# Patient Record
Sex: Female | Born: 1935 | Race: White | Hispanic: No | Marital: Married | Smoking: Never smoker
Health system: Southern US, Community
[De-identification: ages and names within clinical notes are randomized; demographics above are authoritative.]

## PROBLEM LIST (undated history)

## (undated) DIAGNOSIS — F0390 Unspecified dementia without behavioral disturbance: Secondary | ICD-10-CM

## (undated) DIAGNOSIS — I480 Paroxysmal atrial fibrillation: Secondary | ICD-10-CM

## (undated) DIAGNOSIS — G1221 Amyotrophic lateral sclerosis: Secondary | ICD-10-CM

## (undated) DIAGNOSIS — K449 Diaphragmatic hernia without obstruction or gangrene: Secondary | ICD-10-CM

## (undated) DIAGNOSIS — K219 Gastro-esophageal reflux disease without esophagitis: Secondary | ICD-10-CM

## (undated) DIAGNOSIS — E785 Hyperlipidemia, unspecified: Secondary | ICD-10-CM

## (undated) DIAGNOSIS — F329 Major depressive disorder, single episode, unspecified: Secondary | ICD-10-CM

## (undated) DIAGNOSIS — D1771 Benign lipomatous neoplasm of kidney: Secondary | ICD-10-CM

## (undated) DIAGNOSIS — D369 Benign neoplasm, unspecified site: Secondary | ICD-10-CM

## (undated) DIAGNOSIS — I10 Essential (primary) hypertension: Secondary | ICD-10-CM

## (undated) DIAGNOSIS — I2510 Atherosclerotic heart disease of native coronary artery without angina pectoris: Secondary | ICD-10-CM

## (undated) DIAGNOSIS — R011 Cardiac murmur, unspecified: Secondary | ICD-10-CM

## (undated) DIAGNOSIS — F32A Depression, unspecified: Secondary | ICD-10-CM

## (undated) DIAGNOSIS — R002 Palpitations: Secondary | ICD-10-CM

## (undated) DIAGNOSIS — N281 Cyst of kidney, acquired: Secondary | ICD-10-CM

## (undated) HISTORY — DX: Major depressive disorder, single episode, unspecified: F32.9

## (undated) HISTORY — DX: Cyst of kidney, acquired: N28.1

## (undated) HISTORY — DX: Gastro-esophageal reflux disease without esophagitis: K21.9

## (undated) HISTORY — DX: Hyperlipidemia, unspecified: E78.5

## (undated) HISTORY — DX: Cardiac murmur, unspecified: R01.1

## (undated) HISTORY — DX: Depression, unspecified: F32.A

## (undated) HISTORY — PX: EYE SURGERY: SHX253

## (undated) HISTORY — DX: Essential (primary) hypertension: I10

## (undated) HISTORY — DX: Benign neoplasm, unspecified site: D36.9

## (undated) HISTORY — DX: Palpitations: R00.2

## (undated) HISTORY — DX: Diaphragmatic hernia without obstruction or gangrene: K44.9

## (undated) HISTORY — DX: Benign lipomatous neoplasm of kidney: D17.71

---

## 1998-03-31 ENCOUNTER — Ambulatory Visit (HOSPITAL_COMMUNITY): Admission: RE | Admit: 1998-03-31 | Discharge: 1998-03-31 | Payer: Self-pay | Admitting: Gastroenterology

## 1999-03-17 ENCOUNTER — Encounter: Admission: RE | Admit: 1999-03-17 | Discharge: 1999-03-17 | Payer: Self-pay | Admitting: Obstetrics and Gynecology

## 1999-03-17 ENCOUNTER — Encounter: Payer: Self-pay | Admitting: Obstetrics and Gynecology

## 1999-07-25 ENCOUNTER — Encounter: Admission: RE | Admit: 1999-07-25 | Discharge: 1999-07-25 | Payer: Self-pay | Admitting: Internal Medicine

## 1999-07-25 ENCOUNTER — Encounter: Payer: Self-pay | Admitting: Internal Medicine

## 1999-11-11 ENCOUNTER — Other Ambulatory Visit: Admission: RE | Admit: 1999-11-11 | Discharge: 1999-11-11 | Payer: Self-pay | Admitting: Obstetrics and Gynecology

## 2000-03-20 ENCOUNTER — Encounter: Payer: Self-pay | Admitting: Obstetrics and Gynecology

## 2000-03-20 ENCOUNTER — Encounter: Admission: RE | Admit: 2000-03-20 | Discharge: 2000-03-20 | Payer: Self-pay | Admitting: Obstetrics and Gynecology

## 2000-09-25 ENCOUNTER — Ambulatory Visit (HOSPITAL_COMMUNITY): Admission: RE | Admit: 2000-09-25 | Discharge: 2000-09-25 | Payer: Self-pay | Admitting: Internal Medicine

## 2000-11-27 ENCOUNTER — Encounter (INDEPENDENT_AMBULATORY_CARE_PROVIDER_SITE_OTHER): Payer: Self-pay | Admitting: Specialist

## 2000-11-27 ENCOUNTER — Ambulatory Visit (HOSPITAL_COMMUNITY): Admission: RE | Admit: 2000-11-27 | Discharge: 2000-11-27 | Payer: Self-pay | Admitting: Gastroenterology

## 2001-02-12 ENCOUNTER — Emergency Department (HOSPITAL_COMMUNITY): Admission: EM | Admit: 2001-02-12 | Discharge: 2001-02-12 | Payer: Self-pay | Admitting: *Deleted

## 2001-04-16 ENCOUNTER — Encounter: Payer: Self-pay | Admitting: Obstetrics and Gynecology

## 2001-04-16 ENCOUNTER — Encounter: Admission: RE | Admit: 2001-04-16 | Discharge: 2001-04-16 | Payer: Self-pay | Admitting: Obstetrics and Gynecology

## 2001-05-03 ENCOUNTER — Other Ambulatory Visit: Admission: RE | Admit: 2001-05-03 | Discharge: 2001-05-03 | Payer: Self-pay | Admitting: Obstetrics and Gynecology

## 2001-12-20 ENCOUNTER — Encounter: Admission: RE | Admit: 2001-12-20 | Discharge: 2001-12-20 | Payer: Self-pay | Admitting: Obstetrics and Gynecology

## 2001-12-20 ENCOUNTER — Encounter: Payer: Self-pay | Admitting: Obstetrics and Gynecology

## 2002-08-21 ENCOUNTER — Encounter: Admission: RE | Admit: 2002-08-21 | Discharge: 2002-08-21 | Payer: Self-pay | Admitting: Obstetrics and Gynecology

## 2002-08-21 ENCOUNTER — Encounter: Payer: Self-pay | Admitting: Obstetrics and Gynecology

## 2003-10-28 ENCOUNTER — Encounter: Admission: RE | Admit: 2003-10-28 | Discharge: 2003-10-28 | Payer: Self-pay | Admitting: Obstetrics and Gynecology

## 2004-03-30 ENCOUNTER — Ambulatory Visit (HOSPITAL_COMMUNITY): Admission: RE | Admit: 2004-03-30 | Discharge: 2004-03-30 | Payer: Self-pay | Admitting: Internal Medicine

## 2004-04-07 ENCOUNTER — Encounter: Admission: RE | Admit: 2004-04-07 | Discharge: 2004-04-07 | Payer: Self-pay | Admitting: Internal Medicine

## 2004-05-10 ENCOUNTER — Encounter: Admission: RE | Admit: 2004-05-10 | Discharge: 2004-05-10 | Payer: Self-pay | Admitting: Family Medicine

## 2004-09-26 ENCOUNTER — Encounter: Admission: RE | Admit: 2004-09-26 | Discharge: 2004-09-26 | Payer: Self-pay | Admitting: Gastroenterology

## 2004-10-03 ENCOUNTER — Encounter: Admission: RE | Admit: 2004-10-03 | Discharge: 2004-10-03 | Payer: Self-pay | Admitting: Gastroenterology

## 2005-02-09 ENCOUNTER — Encounter: Admission: RE | Admit: 2005-02-09 | Discharge: 2005-02-09 | Payer: Self-pay | Admitting: Obstetrics and Gynecology

## 2005-06-19 ENCOUNTER — Ambulatory Visit: Payer: Self-pay | Admitting: Internal Medicine

## 2005-08-03 ENCOUNTER — Ambulatory Visit: Payer: Self-pay

## 2005-08-03 ENCOUNTER — Encounter: Payer: Self-pay | Admitting: Cardiology

## 2006-03-06 ENCOUNTER — Encounter: Admission: RE | Admit: 2006-03-06 | Discharge: 2006-03-06 | Payer: Self-pay | Admitting: Obstetrics and Gynecology

## 2006-06-05 ENCOUNTER — Encounter: Admission: RE | Admit: 2006-06-05 | Discharge: 2006-06-05 | Payer: Self-pay | Admitting: Orthopedic Surgery

## 2006-06-06 ENCOUNTER — Ambulatory Visit (HOSPITAL_BASED_OUTPATIENT_CLINIC_OR_DEPARTMENT_OTHER): Admission: RE | Admit: 2006-06-06 | Discharge: 2006-06-06 | Payer: Self-pay | Admitting: Orthopedic Surgery

## 2006-08-14 ENCOUNTER — Ambulatory Visit: Payer: Self-pay | Admitting: Internal Medicine

## 2006-08-14 LAB — CONVERTED CEMR LAB
HDL: 50.8 mg/dL (ref >39.0)
Total Bilirubin: 0.6 mg/dL (ref 0.3–1.2)
Total CHOL/HDL Ratio: 3.9
Triglycerides: 112 mg/dL (ref 0–149)

## 2006-09-07 ENCOUNTER — Ambulatory Visit: Payer: Self-pay | Admitting: Family Medicine

## 2006-09-07 DIAGNOSIS — I10 Essential (primary) hypertension: Secondary | ICD-10-CM | POA: Insufficient documentation

## 2006-09-07 DIAGNOSIS — F3131 Bipolar disorder, current episode depressed, mild: Secondary | ICD-10-CM | POA: Insufficient documentation

## 2006-09-07 DIAGNOSIS — Z981 Arthrodesis status: Secondary | ICD-10-CM

## 2006-09-13 LAB — CONVERTED CEMR LAB
BUN: 10 mg/dL (ref 6–23)
Basophils Relative: 0.6 % (ref 0.0–1.0)
Calcium: 9.6 mg/dL (ref 8.4–10.5)
Chloride: 105 meq/L (ref 96–112)
GFR calc non Af Amer: 75 mL/min
HCT: 43.4 % (ref 36.0–46.0)
Hemoglobin: 14.7 g/dL (ref 12.0–15.0)
Lymphocytes Relative: 25.4 % (ref 12.0–46.0)
MCV: 89 fL (ref 78.0–100.0)
Monocytes Absolute: 0.4 10*3/uL (ref 0.2–0.7)
Monocytes Relative: 5.8 % (ref 3.0–11.0)
Potassium: 4.1 meq/L (ref 3.5–5.1)
RBC: 4.88 M/uL (ref 3.87–5.11)
RDW: 13.6 % (ref 11.5–14.6)
WBC: 7 10*3/uL (ref 4.5–10.5)

## 2006-12-11 ENCOUNTER — Telehealth: Payer: Self-pay | Admitting: Family Medicine

## 2007-03-06 ENCOUNTER — Encounter: Payer: Self-pay | Admitting: Family Medicine

## 2007-03-11 ENCOUNTER — Encounter: Admission: RE | Admit: 2007-03-11 | Discharge: 2007-03-11 | Payer: Self-pay | Admitting: Obstetrics and Gynecology

## 2007-09-26 ENCOUNTER — Ambulatory Visit: Payer: Self-pay | Admitting: Internal Medicine

## 2007-09-26 LAB — CONVERTED CEMR LAB
ALT: 17 units/L (ref 0–35)
GFR calc Af Amer: 91 mL/min
GFR calc non Af Amer: 75 mL/min
Glucose, Bld: 102 mg/dL — ABNORMAL HIGH (ref 70–99)
Potassium: 4.6 meq/L (ref 3.5–5.1)

## 2007-10-30 ENCOUNTER — Telehealth: Payer: Self-pay | Admitting: Family Medicine

## 2007-10-30 ENCOUNTER — Ambulatory Visit: Payer: Self-pay | Admitting: Family Medicine

## 2007-10-30 DIAGNOSIS — R42 Dizziness and giddiness: Secondary | ICD-10-CM | POA: Insufficient documentation

## 2007-11-07 ENCOUNTER — Ambulatory Visit: Payer: Self-pay | Admitting: Internal Medicine

## 2007-11-11 ENCOUNTER — Telehealth: Payer: Self-pay | Admitting: Family Medicine

## 2007-11-12 ENCOUNTER — Encounter (INDEPENDENT_AMBULATORY_CARE_PROVIDER_SITE_OTHER): Payer: Self-pay | Admitting: *Deleted

## 2007-11-22 ENCOUNTER — Ambulatory Visit: Payer: Self-pay | Admitting: Family Medicine

## 2008-02-21 ENCOUNTER — Ambulatory Visit: Payer: Self-pay | Admitting: Family Medicine

## 2008-03-09 ENCOUNTER — Encounter (INDEPENDENT_AMBULATORY_CARE_PROVIDER_SITE_OTHER): Payer: Self-pay | Admitting: *Deleted

## 2008-03-09 LAB — CONVERTED CEMR LAB
BUN: 13 mg/dL (ref 6–23)
CO2: 30 meq/L (ref 19–32)
GFR calc non Af Amer: 87 mL/min
Glucose, Bld: 93 mg/dL (ref 70–99)
Potassium: 4.1 meq/L (ref 3.5–5.1)
VLDL: 28 mg/dL (ref 0–40)

## 2008-03-17 ENCOUNTER — Encounter: Admission: RE | Admit: 2008-03-17 | Discharge: 2008-03-17 | Payer: Self-pay | Admitting: Family Medicine

## 2008-03-20 ENCOUNTER — Encounter (INDEPENDENT_AMBULATORY_CARE_PROVIDER_SITE_OTHER): Payer: Self-pay | Admitting: *Deleted

## 2008-07-02 ENCOUNTER — Ambulatory Visit: Payer: Self-pay | Admitting: Family Medicine

## 2008-07-02 DIAGNOSIS — J301 Allergic rhinitis due to pollen: Secondary | ICD-10-CM

## 2008-07-14 ENCOUNTER — Telehealth (INDEPENDENT_AMBULATORY_CARE_PROVIDER_SITE_OTHER): Payer: Self-pay | Admitting: *Deleted

## 2008-07-14 DIAGNOSIS — H9209 Otalgia, unspecified ear: Secondary | ICD-10-CM | POA: Insufficient documentation

## 2008-07-20 ENCOUNTER — Telehealth: Payer: Self-pay | Admitting: Family Medicine

## 2008-09-14 ENCOUNTER — Telehealth: Payer: Self-pay | Admitting: Family Medicine

## 2009-02-09 ENCOUNTER — Telehealth: Payer: Self-pay | Admitting: Internal Medicine

## 2009-02-18 ENCOUNTER — Ambulatory Visit: Payer: Self-pay | Admitting: Internal Medicine

## 2009-02-18 ENCOUNTER — Telehealth (INDEPENDENT_AMBULATORY_CARE_PROVIDER_SITE_OTHER): Payer: Self-pay | Admitting: *Deleted

## 2009-02-18 ENCOUNTER — Ambulatory Visit: Payer: Self-pay | Admitting: Diagnostic Radiology

## 2009-02-18 ENCOUNTER — Emergency Department (HOSPITAL_BASED_OUTPATIENT_CLINIC_OR_DEPARTMENT_OTHER): Admission: EM | Admit: 2009-02-18 | Discharge: 2009-02-18 | Payer: Self-pay | Admitting: Emergency Medicine

## 2009-02-18 DIAGNOSIS — R109 Unspecified abdominal pain: Secondary | ICD-10-CM

## 2009-02-19 ENCOUNTER — Encounter: Payer: Self-pay | Admitting: Family Medicine

## 2009-02-22 ENCOUNTER — Encounter (INDEPENDENT_AMBULATORY_CARE_PROVIDER_SITE_OTHER): Payer: Self-pay | Admitting: *Deleted

## 2009-02-23 ENCOUNTER — Telehealth: Payer: Self-pay | Admitting: Internal Medicine

## 2009-03-05 ENCOUNTER — Telehealth: Payer: Self-pay | Admitting: Family Medicine

## 2009-03-09 ENCOUNTER — Ambulatory Visit: Payer: Self-pay | Admitting: Internal Medicine

## 2009-03-11 LAB — CONVERTED CEMR LAB
AST: 20 units/L (ref 0–37)
CO2: 29 meq/L (ref 19–32)
Calcium: 9.1 mg/dL (ref 8.4–10.5)
Chloride: 105 meq/L (ref 96–112)
Cholesterol: 162 mg/dL (ref 0–200)
Creatinine, Ser: 0.9 mg/dL (ref 0.4–1.2)
GFR calc non Af Amer: 65.08 mL/min (ref >60)
Sodium: 140 meq/L (ref 135–145)
Total CHOL/HDL Ratio: 4
VLDL: 26.6 mg/dL (ref 0.0–40.0)

## 2009-03-12 ENCOUNTER — Ambulatory Visit: Payer: Self-pay | Admitting: Internal Medicine

## 2009-03-12 DIAGNOSIS — R002 Palpitations: Secondary | ICD-10-CM | POA: Insufficient documentation

## 2009-03-12 DIAGNOSIS — R9431 Abnormal electrocardiogram [ECG] [EKG]: Secondary | ICD-10-CM

## 2009-03-30 ENCOUNTER — Encounter: Payer: Self-pay | Admitting: Internal Medicine

## 2009-03-30 ENCOUNTER — Ambulatory Visit (HOSPITAL_COMMUNITY): Admission: RE | Admit: 2009-03-30 | Discharge: 2009-03-30 | Payer: Self-pay | Admitting: Internal Medicine

## 2009-03-30 ENCOUNTER — Ambulatory Visit: Payer: Self-pay

## 2009-03-30 ENCOUNTER — Ambulatory Visit: Payer: Self-pay | Admitting: Internal Medicine

## 2009-03-30 ENCOUNTER — Ambulatory Visit: Payer: Self-pay | Admitting: Cardiology

## 2009-04-02 ENCOUNTER — Telehealth: Payer: Self-pay | Admitting: Internal Medicine

## 2009-04-05 ENCOUNTER — Encounter: Payer: Self-pay | Admitting: Internal Medicine

## 2009-04-15 ENCOUNTER — Encounter: Payer: Self-pay | Admitting: Family Medicine

## 2009-04-27 ENCOUNTER — Encounter: Payer: Self-pay | Admitting: Family Medicine

## 2009-05-03 ENCOUNTER — Telehealth: Payer: Self-pay | Admitting: Internal Medicine

## 2009-06-02 ENCOUNTER — Encounter: Payer: Self-pay | Admitting: Family Medicine

## 2009-06-15 ENCOUNTER — Encounter: Admission: RE | Admit: 2009-06-15 | Discharge: 2009-06-15 | Payer: Self-pay | Admitting: Obstetrics and Gynecology

## 2010-02-09 ENCOUNTER — Encounter: Payer: Self-pay | Admitting: Family Medicine

## 2010-03-06 LAB — CONVERTED CEMR LAB
Basophils Absolute: 0.2 10*3/uL — ABNORMAL HIGH (ref 0.0–0.1)
Basophils Relative: 2.9 % (ref 0.0–3.0)
Bilirubin Urine: NEGATIVE
CO2: 29 meq/L (ref 19–32)
Calcium: 9.3 mg/dL (ref 8.4–10.5)
Chloride: 108 meq/L (ref 96–112)
Creatinine, Ser: 0.9 mg/dL (ref 0.4–1.2)
Folate: 17.5 ng/mL
GFR calc Af Amer: 79 mL/min
GFR calc non Af Amer: 65 mL/min
Glucose, Urine, Semiquant: NEGATIVE
HCT: 39.1 % (ref 36.0–46.0)
Hemoglobin: 13.5 g/dL (ref 12.0–15.0)
Lymphocytes Relative: 23.8 % (ref 12.0–46.0)
MCHC: 34.5 g/dL (ref 30.0–36.0)
MCV: 89.4 fL (ref 78.0–100.0)
Monocytes Relative: 8.4 % (ref 3.0–12.0)
Neutro Abs: 3.3 10*3/uL (ref 1.4–7.7)
Neutrophils Relative %: 63.2 % (ref 43.0–77.0)
Protein, U semiquant: 30
RBC: 4.37 M/uL (ref 3.87–5.11)
Sodium: 144 meq/L (ref 135–145)
Specific Gravity, Urine: >=1.03
Urobilinogen, UA: 0.2
WBC: 5.4 10*3/uL (ref 4.5–10.5)

## 2010-03-10 NOTE — Letter (Signed)
Summary: Results Follow up Letter  [facility]  [address] [place], [place] [address]   Phone: [phone]  Fax: [phone]    02/22/2009 MRN: [id number]  [name] [address], [place]  [address]  Dear [name],  The following are the results of your recent test(s):  Test         Result    Pap Smear:        Normal _____  Not Normal _____ Comments: ______________________________________________________ Cholesterol: LDL(Bad cholesterol):         Your goal is less than:         HDL (Good cholesterol):       Your goal is more than: Comments:  ______________________________________________________ Mammogram:        Normal _____  Not Normal _____ Comments:  ___________________________________________________________________ Hemoccult:        Normal _____  Not normal _______ Comments:    _____________________________________________________________________ Other Tests:  See attachment for your results.   We routinely do not discuss normal results over the telephone.  If you desire a copy of the results, or you have any questions about this information we can discuss them at your next office visit.   Sincerely,    [name] CMA  February 22, 2009 8:37 AM

## 2010-03-10 NOTE — Assessment & Plan Note (Signed)
Summary: f70m/kfw      Allergies Added:   Visit Type:  Follow-up Primary Provider:  Loreen Freud DO  CC:  palpitations.  History of Present Illness: Patient is a 77 year old with a history of dyslipidemia, hypertension and palpitations.  I last saw her in October2009.  She has now been noticing more palpitations.  they occur about 1x per week.  She has had chronic dizziness (seen by ENT).  Palpitations do not make it worse.  The spell she had last night scared her. Denies chest pains, no change in her breathing.  Current Medications (verified): 1)  Lamotrigine 200 Mg Tabs (Lamotrigine) .Marland Kitchen.. 1 By Mouth Two Times A Day. 2)  Zocor 80 Mg  Tabs (Simvastatin) .Marland Kitchen.. 1 At Bedtime 3)  Nexium 40 Mg  Cpdr (Esomeprazole Magnesium) .Marland Kitchen.. 1 Once Daily 4)  Zostavax 16109 Unt/0.66ml  Solr (Zoster Vaccine Live) .Marland Kitchen.. 1 Ml  Im  X 1 5)  Antivert 25 Mg Tabs (Meclizine Hcl) .Marland Kitchen.. 1 By Mouth Qid As Needed Dizziness 6)  Maxzide-25 37.5-25 Mg Tabs (Triamterene-Hctz) .... Take One Tablet Daily 7)  Atenolol 25 Mg Tabs (Atenolol) .Marland Kitchen.. 1 Tablet Every Day  Allergies (verified): 1)  ! Jonne Ply  Past History:  Past Medical History: Last updated: 03/11/2009 Hypertension  dyslipidemia Palpitations  Review of Systems       All systems reviewed.  negative to the above problem except as noted above.  Vital Signs:  Patient profile:   77 year old female Height:      66 inches Weight:      149 pounds Pulse rate:   74 / minute BP sitting:   115 / 69  (left arm) Cuff size:   regular  Vitals Entered By: Burnett Kanaris, CNA (March 12, 2009 12:47 PM)  Physical Exam  Additional Exam:  HEENT:  Normocephalic, atraumatic. EOMI, PERRLA.  Neck: JVP is normal. No thyromegaly. Question L bruit. Lungs: clear to auscultation. No rales no wheezes.  Heart: Regular rate and rhythm. Normal S1, S2. No S3.   Gr II/VI systolic murmur at base. PMI not displaced.  Abdomen:  Supple, nontender. Normal bowel sounds. No masses. No  hepatomegaly.  Extremities:   Good distal pulses throughout. No lower extremity edema.  Musculoskeletal :moving all extremities.  Neuro:   alert and oriented x3.    EKG  Procedure date:  03/12/2009  Findings:      NSR.  68 bpm.  IIncomp RBBB.  T wave inversion V3-V5, III, AVF (new)  Impression & Recommendations:  Problem # 1:  PALPITATIONS (ICD-785.1) I am not sure what these represent.  I would recommend an event monitor to evaluate.  Also set the patient up for an echo with the change in her ECG. Her updated medication list for this problem includes:    Atenolol 25 Mg Tabs (Atenolol) .Marland Kitchen... 1 tablet every day  Problem # 2:  DIZZINESS (ICD-780.4) Chronic.  Patient said she had a scan of her neck.  Will try to retrieve.  Problem # 3:  HYPERTENSION (ICD-401.9) COntinue meds. Her updated medication list for this problem includes:    Maxzide-25 37.5-25 Mg Tabs (Triamterene-hctz) .Marland Kitchen... Take one tablet daily    Atenolol 25 Mg Tabs (Atenolol) .Marland Kitchen... 1 tablet every day  Other Orders: Echocardiogram (Echo) Event (Event)  Patient Instructions: 1)  Your physician recommends that you schedule a follow-up appointment in: 12 months 2)  Your physician has requested that you have an echocardiogram.  Echocardiography is a painless test  that uses sound waves to create images of your heart. It provides your doctor with information about the size and shape of your heart and how well your heart's chambers and valves are working.  This procedure takes approximately one hour. There are no restrictions for this procedure. 3)  Your physician has recommended that you wear an event monitor.  Event monitors are medical devices that record the heart's electrical activity. Doctors most often use these monitors to diagnose arrhythmias. Arrhythmias are problems with the speed or rhythm of the heartbeat. The monitor is a small, portable device. You can wear one while you do your normal daily activities. This is  usually used to diagnose what is causing palpitations/syncope (passing out). Prescriptions: ATENOLOL 25 MG TABS (ATENOLOL) 1 tablet every day  #90 x 3   Entered by:   Layne Benton, RN, BSN   Authorized by:   Sherrill Raring, MD, [facility]   Signed by:   Layne Benton, RN, BSN on 03/12/2009   Method used:   Electronically to        [facility] (retail)       [address]       [place], [place]  [address]       Ph: [phone]       Fax: [phone]   RxID:   [id number] MAXZIDE-25 37.5-25 MG TABS (TRIAMTERENE-HCTZ) Take one tablet daily  #90 x 3   Entered by:   Layne Benton, RN, BSN   Authorized by:   Sherrill Raring, MD, [facility]   Signed by:   Layne Benton, RN, BSN on 03/12/2009   Method used:   Electronically to        [facility] (retail)       [address]       [place], [place]  [address]       Ph: [phone]       Fax: [phone]   RxID:   [id number] ZOCOR 80 MG  TABS (SIMVASTATIN) 1 at bedtime  #90 x 3   Entered by:   Layne Benton, RN, BSN   Authorized by:   Sherrill Raring, MD, [facility]   Signed by:   Layne Benton, RN, BSN on 03/12/2009   Method used:   Electronically to        [facility] (retail)       [address]       [place], [place]  [address]       Ph: [phone]       Fax: [phone]   RxID:   [id number]

## 2010-03-10 NOTE — Progress Notes (Signed)
Summary: REFILL   Phone Note Refill Request Message from:  Patient on May 03, 2009 4:23 PM  Refills Requested: Medication #1:  ATENOLOL 25 MG TABS 1 and one half  tablet every day. [facility] [phone] NEED 90 SUPPLY  Initial call taken by: Judie Grieve,  May 03, 2009 4:24 PM    Prescriptions: ATENOLOL 25 MG TABS (ATENOLOL) 1 and one half  tablet every day  #45 x 11   Entered by:   Layne Benton, RN, BSN   Authorized by:   Sherrill Raring, MD, [facility]   Signed by:   Layne Benton, RN, BSN on 05/06/2009   Method used:   Electronically to        [facility] (retail)       [address]       [place], [place]  [address]       Ph: [phone]       Fax: [phone]   RxID:   [id number]

## 2010-03-10 NOTE — Progress Notes (Signed)
Summary: pt rtn call to jackie from yeaterday   Phone Note Call from Patient Call back at Work Phone [phone]   Caller: Patient Reason for Call: Talk to Nurse, Talk to Doctor Summary of Call: Annice Pih callled patient yesterday and she is not sure why she got the call Initial call taken by: Omer Jack,  April 02, 2009 2:18 PM  Follow-up for Phone Call        Spoke with pt. Echo results and MD's recommendations given. ( to take Atenolol 1/12 tablets( 37.5 mg). & see how she feels. Call in 2 to 3 wks). Ollen Gross, RN, BSN  April 02, 2009 3:03 PM

## 2010-03-10 NOTE — Progress Notes (Signed)
Summary: Xray Results  Phone Note Call from Patient Call back at Home Phone [phone]   Caller: Patient Reason for Call: Lab or Test Results Summary of Call: Patient called and left voice message requesting the results from her scan. Her message states she was advised by the ER to follow up with Dr Artist Pais about the test results Initial call taken by: Glendell Docker CMA,  February 23, 2009 3:44 PM  Follow-up for Phone Call        Pt reports ER physicians told her CT scan of abd and pelvic negative for acute changes.   I called pt and reviewed CT scan in detail.  right hepatic lesion, left renal angimyolipoma and right renal cyst, small area of mesenteric low attenuation duodenum and gallstones - stable from prev scan in 2006 ordered by Dr. Sherin Quarry.  her suprapubic pain is resolved.    I will forward this note to Dr. Laury Axon.   Follow-up by: D. Thomos Lemons DO,  February 23, 2009 5:11 PM

## 2010-03-10 NOTE — Miscellaneous (Signed)
  Clinical Lists Changes  Medications: Changed medication from ATENOLOL 25 MG TABS (ATENOLOL) 1 tablet every day to ATENOLOL 25 MG TABS (ATENOLOL) 1 and one half  tablet every day

## 2010-03-10 NOTE — Letter (Signed)
Summary: [facility]  [facility]   Imported By: Lanelle Bal 04/23/2009 13:34:09  _____________________________________________________________________  External Attachment:    Type:   Image     Comment:   External Document

## 2010-03-10 NOTE — Procedures (Signed)
Summary: EGD/[facility]  EGD/[facility]   Imported By: Lanelle Bal 02/18/2010 08:52:25  _____________________________________________________________________  External Attachment:    Type:   Image     Comment:   External Document

## 2010-03-10 NOTE — Procedures (Signed)
Summary: Colonoscopy/[facility]  Colonoscopy/[facility]   Imported By: Lanelle Bal 06/07/2009 11:53:32  _____________________________________________________________________  External Attachment:    Type:   Image     Comment:   External Document

## 2010-03-10 NOTE — Letter (Signed)
Summary: [facility]  [facility]   Imported By: Lanelle Bal 05/12/2009 12:48:37  _____________________________________________________________________  External Attachment:    Type:   Image     Comment:   External Document

## 2010-03-10 NOTE — Progress Notes (Signed)
Summary: REFERRAL REQUEST  Phone Note Call from Patient Call back at Home Phone [phone]   Caller: Patient Call For: Loreen Freud DO Reason for Call: Referral Summary of Call: PATIENT CALLING, REQUESTS NEW REFERRAL FOR SCREENING COLON.  HER LAST COLON WAS W/DR. THAT HAS RETIRED.  PT WANTS REFERRAL TO DR. MANN. Initial call taken by: Magdalen Spatz [facility],  March 05, 2009 1:40 PM  Follow-up for Phone Call        ACCORDING TO LAST COLON---NOT DUE UNTIL THIS SUMMER ---IS SHE HAVING A PROBLEM? Follow-up by: Loreen Freud DO,  March 05, 2009 2:03 PM  Additional Follow-up for Phone Call Additional follow up Details #1::        NO PROBLEMS, JUST THINKS SHE IS DUE FOR HER COLON. Magdalen Spatz [facility]  March 05, 2009 2:23 PM   New Problems: PREVENTIVE HEALTH CARE (ICD-V70.0)   Additional Follow-up for Phone Call Additional follow up Details #2::    i need her paper chart Follow-up by: Loreen Freud DO,  March 05, 2009 5:31 PM  Additional Follow-up for Phone Call Additional follow up Details #3:: Details for Additional Follow-up Action Taken: Pts does not have paper chart. Army Fossa CMA  March 08, 2009 11:28 AM  referral put in  yrlowne  03/08/2009  1230   New Problems: PREVENTIVE HEALTH CARE (ICD-V70.0)

## 2010-03-10 NOTE — Progress Notes (Signed)
Summary: pain lower back, side  Phone Note Call from Patient   Caller: Patient Summary of Call: pt c/o throbbing pain in lower back, and both sides xseveral days. pt denies any fever, swelling, redness, tenderness to touch, burning, blood, frequency, or urgency with urination.  Pt coming in today,pt advise UC if symptoms worsen or increase prior to appt, pt ok............Marland KitchenFelecia Deloach CMA  February 18, 2009 11:03 AM

## 2010-03-10 NOTE — Assessment & Plan Note (Signed)
Summary: pain lower back, side//fd   Vital Signs:  Patient profile:   77 year old female Weight:      148.75 pounds BMI:     23.73 O2 Sat:      98 % on Room air Temp:     98.2 degrees F oral Pulse rate:   69 / minute Pulse rhythm:   regular Resp:     18 per minute BP sitting:   116 / 60  (right arm) Cuff size:   regular  Vitals Entered By: Glendell Docker CMA (February 18, 2009 4:30 PM)  O2 Flow:  Room air  Primary Care Provider:  Loreen Freud DO  CC:  lower back & abdomen pain.  History of Present Illness: 77 y/o white female  c/o lower  abdomen pain for the past week. she notes 8 out 10 sharp pain. pain radiates to bilateral groin.  normal BM this AM.  she has assoc low back ache.  no fever or chills.  she felt nauseated and vomited x 1.  she took and symptoms improved.  no dysuria but noticed she has urinated less.    Allergies: 1)  ! Jonne Ply  Past History:  Past Medical History: Hypertension     Family History: 3 BROTHERS LIVING MOTHER: DECEASED FATHER: DECEASED Family History Hypertension: FATHER HEART: MOTHER AND FATHER Family History of Alcoholism/Addiction: BROTHER Family History Depression: MOTHER Family History of Colon CA 1st degree relative <60: MOTHER  Family History of Prostate CA 1st degree relative <50; FATHER     Review of Systems  The patient denies fever, chest pain, melena, and hematochezia.    Physical Exam  General:  alert, well-developed, and well-nourished.   Neck:  No deformities, masses, or tenderness noted. Lungs:  normal respiratory effort, normal breath sounds, no crackles, and no wheezes.   Heart:  normal rate, regular rhythm, and no gallop.   Abdomen:  no flank tendernesssoft, non-tender, no masses, no guarding, and no rigidity.  decreased bowel sounds Extremities:  No lower extremity edema  Neurologic:  cranial nerves II-XII intact and gait normal.   Psych:  normally interactive and good eye contact.     Impression &  Recommendations:  Problem # 1:  ABDOMINAL PAIN, LOWER (ICD-789.09) 77 y/o with lower abd pain of unclear etiology.   UA shows trace blood.  Possibly passed kidney stone.   check CT of abd and pelvis.  check labs.  increase fluid intake.  Patient advised to call office if symptoms persist or worsen.  F/U with PCP within 1 week. Orders: T-Basic Metabolic Panel [id number]) T-CBC w/Diff [id number]) T-Sed Rate (Automated) [id number]) CT without Contrast (CT w/o contrast) T-Culture, Urine [id number]) Specimen Handling ([id number])  Complete Medication List: 1)  Lamotrigine 200 Mg Tabs (Lamotrigine) .Marland Kitchen.. 1 by mouth two times a day. 2)  Zocor 80 Mg Tabs (Simvastatin) .Marland Kitchen.. 1 at bedtime 3)  Nexium 40 Mg Cpdr (Esomeprazole magnesium) .Marland Kitchen.. 1 once daily 4)  Zostavax 32951 Unt/0.46ml Solr (Zoster vaccine live) .Marland Kitchen.. 1 ml  im  x 1 5)  Antivert 25 Mg Tabs (Meclizine hcl) .Marland Kitchen.. 1 by mouth qid as needed dizziness 6)  Maxzide-25 37.5-25 Mg Tabs (Triamterene-hctz) .... Take one tablet daily 7)  Atenolol 25 Mg Tabs (Atenolol) .Marland Kitchen.. 1 tablet every day  Other Orders: UA Dipstick w/o Micro (manual) ([id number])  Patient Instructions: 1)  Follow up with Dr. Laury Axon in 1 week. 2)  Increase fluid intake 3)  Take vicodin as needed for pain  Laboratory Results  Urine Tests    Routine Urinalysis   Color: straw Appearance: Hazy Glucose: negative   (Normal Range: Negative) Bilirubin: negative   (Normal Range: Negative) Ketone: negative   (Normal Range: Negative) Spec. Gravity: >=1.030   (Normal Range: 1.003-1.035) Blood: trace-intact   (Normal Range: Negative) pH: 6.0   (Normal Range: 5.0-8.0) Protein: 30   (Normal Range: Negative) Urobilinogen: 0.2   (Normal Range: 0-1) Nitrite: negative   (Normal Range: Negative) Leukocyte Esterace: negative   (Normal Range: Negative)        Appended Document: pain lower back, side//fd Append HPI - she had left over vicodin from prev ortho procedue.  abd pain  better after taking vicodin

## 2010-03-10 NOTE — Progress Notes (Signed)
Summary: dizziness/need for labs  Medications Added ATENOLOL 25 MG TABS (ATENOLOL) 1 tablet every day       Phone Note Call from Patient Call back at Home Phone [phone]   Caller: Patient Reason for Call: Talk to Nurse Summary of Call: Patient experiencing dizzy spells for several months.  Appointment scheduled 03-12-08.  Would like to speak to RN.  Also has questions about potential labs. Initial call taken by: Burnard Leigh,  February 09, 2009 9:40 AM  Follow-up for Phone Call        Called patient back, she wanted to set up labs to check cholesterol level since she has not had any lab work in almost 1 year and is still taking Simvastatin. Fasting lab work set up for 03/09/2009 prior to M.D. visit. Also will refill atenolol. Follow-up by: J REISS RN    New/Updated Medications: ATENOLOL 25 MG TABS (ATENOLOL) 1 tablet every day Prescriptions: ATENOLOL 25 MG TABS (ATENOLOL) 1 tablet every day  #30 x 2   Entered by:   Layne Benton, RN, BSN   Authorized by:   Sherrill Raring, MD, [facility]   Signed by:   Layne Benton, RN, BSN on 02/09/2009   Method used:   Electronically to        [facility] (retail)       [address]       [place], [place]  [address]       Ph: [phone]       Fax: [phone]   RxID:   [id number]

## 2010-04-24 LAB — COMPREHENSIVE METABOLIC PANEL
ALT: 34 U/L (ref 0–35)
CO2: 29 mEq/L (ref 19–32)
Calcium: 8.2 mg/dL — ABNORMAL LOW (ref 8.4–10.5)
GFR calc Af Amer: >60 mL/min (ref >60)
Total Bilirubin: 0.3 mg/dL (ref 0.3–1.2)
Total Protein: 6.4 g/dL (ref 6.0–8.3)

## 2010-04-24 LAB — URINALYSIS, ROUTINE W REFLEX MICROSCOPIC
Ketones, ur: NEGATIVE mg/dL
Specific Gravity, Urine: 1.028 (ref 1.005–1.030)
Urobilinogen, UA: 0.2 mg/dL (ref 0.0–1.0)
pH: 6 (ref 5.0–8.0)

## 2010-04-24 LAB — DIFFERENTIAL
Basophils Relative: 0 % (ref 0–1)
Lymphs Abs: 0.7 10*3/uL (ref 0.7–4.0)
Neutro Abs: 3.4 10*3/uL (ref 1.7–7.7)
Neutrophils Relative %: 76 % (ref 43–77)

## 2010-04-24 LAB — CBC
HCT: 42.7 % (ref 36.0–46.0)
Hemoglobin: 14.4 g/dL (ref 12.0–15.0)
RBC: 4.77 MIL/uL (ref 3.87–5.11)

## 2010-05-01 ENCOUNTER — Other Ambulatory Visit: Payer: Self-pay | Admitting: Internal Medicine

## 2010-05-05 NOTE — Telephone Encounter (Signed)
[address] °

## 2010-06-21 NOTE — Assessment & Plan Note (Signed)
[facility]                            CARDIOLOGY OFFICE NOTE   [name], [name]                  MRN:          [id number]  DATE:09/26/2007                            DOB:          Oct 29, 1933    HISTORY OF PRESENT ILLNESS:  [name] is a 77 year old woman.  I last  saw her in July of last year.  She has a history of palpitations and  hypertension.  She also has a history of chest pressure in the past.   Since seen, she notes no shortness of breath, not working out much,  notes occasional sharp chest pains once per week, does not appear to be  exacerbated by activity.  Notes no significant palpitations.   CURRENT MEDICINES:  1. Lamictal 150 daily.  2. Atenolol 12.5.  3. Nexium daily.  4. Simvastatin 40.  5. Note, the patient is out of Ritalin.   PHYSICAL EXAMINATION:  GENERAL:  The patient is in no distress.  VITAL SIGNS:  Blood pressure is 172/85, pulse is 73, weight 154 up from  145 in last visit.  LUNGS:  Clear.  CARDIAC:  Regular rate and rhythm, S1 and S2.  No S3, no significant  murmurs.  ABDOMEN:  Benign.  EXTREMITIES:  No edema.   IMPRESSION:  1. Hypertension, out of control today.  I would like to check some      labs.  I will also add Maxzide to her regimen 37.5/25 and increase      her atenolol to 25.  Followup in 4 weeks.  2. Palpitations without significant complaints.  3. Dyslipidemia.  We will need to get again fasting lipid.   Note, she is on simvastatin at present.  Last lipids were over 1 year  ago.  I told the patient I will be in touch with her once I have seen  the results.   ADDENDUM:  A 12-lead EKG, sinus rhythm, first-degree A-V block, 67 beats  per minute, PR interval of 218.  Incomplete right bundle-branch block.  Nonspecific T wave changes.     Pricilla Riffle, MD, [facility]  Electronically Signed    PVR/MedQ  DD: 09/26/2007  DT: 09/27/2007  Job #: [id number]

## 2010-06-21 NOTE — Assessment & Plan Note (Signed)
[facility]                            CARDIOLOGY OFFICE NOTE   Janice, [name]                  MRN:          [id number]  DATE:08/14/2006                            DOB:          1933/10/05    IDENTIFICATION:  Ms. Ward is a 77 year old woman who I have seen in  the past.  She has a history of palpitations and hypertension.  She was  last seen back in May of last year.   In the interval she has done okay.  She notes occasional episodes,  mostly when she is laying down in bed, of some left-sided chest  pressure.  She began working out, over the past week or two, an hour per  session at a workout facility and notes no problems with this.  No  significant chest pressure.   She does have a history of reflux and has Nexium daily.   She notes rare palpitations.   CURRENT MEDICATIONS:  1. Lamictal 150 daily.  2. Ritalin 20 b.i.d.  3. Atenolol 10 daily.  4. Simvastatin 40.  5. Nexium daily.   PHYSICAL EXAMINATION:  On exam the patient is in no distress.  Blood  pressure 163/78 on arrival, on my check 140/82.  Pulse is 66, weight  145.  LUNGS:  Clear.  CARDIAC:  Regular rate and rhythm, S1-S2, no S3, no murmurs.  ABDOMEN:  Benign.  EXTREMITIES:  No edema, 2+ pulses.   Twelve lead EKG normal sinus rhythm 75 beats per minute.  RSR prime in  V1.   IMPRESSION:  1. Hypertension, labile.  Again she is on the Ritalin but she is also      on atenolol __________ may indeed need to increase.  I have asked      the patient who has just gotten a cuff to check her blood pressures      at home and I will be in touch with her.  2. Palpitations, rare.  3. Chest pressure, I am not convinced it is cardiac as she is working      out and has not had a problems.  Again, if things change she should      call.  4. Dyslipidemia.  Will check a lipid, LFT today.   I will set followup in one year.  If her blood pressure runs high at  home of course she  should call sooner.     Pricilla Riffle, MD, [facility]  Electronically Signed    PVR/MedQ  DD: 08/14/2006  DT: 08/14/2006  Job #: [id number]   cc:   Titus Dubin. Alwyn Ren, MD,FACP,FCCP

## 2010-06-21 NOTE — Assessment & Plan Note (Signed)
[facility]                            CARDIOLOGY OFFICE NOTE   [name], [name]                  MRN:          [id number]  DATE:11/07/2007                            DOB:          [birth year]    IDENTIFICATION:  [name] is a 77 year old woman.  I last saw her  back in September 26, 2007.  She has a history of hypertension,  palpitations, and dyslipidemia.   When I saw her, blood pressure was out of control.  I added Maxzide to  her regimen (37.5/25) and increased her atenolol to 25.   In the interval, she has done okay.  It did sound like she had an URI  though, and on the recovery period of this developed dizziness.  She was  treated by her local physician, Dr. Laury Axon for this and it improved.  She  has not had any recurrence again.   Current medicines today include,  1. Lamictal 150.  2. Atenolol 25.  3. Nexium daily.  4. Simvastatin, question 80.  5. Maxzide 37.5/25.   PHYSICAL EXAMINATION:  GENERAL:  The patient is in no distress.  VITAL SIGNS:  Blood pressure lying 137/54, pulse 57; sitting 133/69,  pulse 65; standing at 0 minutes 133/75, pulse 59; at 2 minutes 133/76,  pulse 69; at 5 minutes 125/75, pulse 60.  The patient is asymptomatic.  NECK:  JVP is normal.  LUNGS:  Clear.  No rales.  CARDIAC:  Regular rate and rhythm.  S1 and S2.  No S3.  Grade 1-2/6  systolic murmur heard best at the base (early peaking).  ABDOMEN:  Benign.  EXTREMITIES:  No edema.   IMPRESSION:  1. Hypertension.  Better, would continue.  2. Dyslipidemia.  I talked to her about her lipids on the phone.  Her      LDL was 124 on the simvastatin 80 with a particle number of [id number].      I would recommend a trial of Vytorin since she does have dietary      indiscretion.  She is going to check on pricing for this or      coverage and get back in touch with [place].  Keep on simvastatin for      now.  3. Palpitations.  Denies.  4. Dizziness.  Resolved, probably  in a year.   I will set, otherwise, to see the patient back in 6 months.   NOTE:  She is entertaining the thought of getting back on Ritalin for  attention deficit disorder.  If she does this, I would start at a very  low dose and followup blood pressure closely.  If it goes up, I would be  in favor of not taking it.     Pricilla Riffle, MD, [facility]  Electronically Signed    PVR/MedQ  DD: 11/07/2007  DT: 11/07/2007  Job #: [id number]   cc:   Lelon Perla, DO

## 2010-06-21 NOTE — Assessment & Plan Note (Signed)
[facility]                            CARDIOLOGY OFFICE NOTE   CASMIRA, CRAMER                  MRN:          [id number]  DATE:11/07/2007                            DOB:          Oct 01, 1933    IDENTIFICATION:  [name] is a 77 year old woman.  I last saw her  back in August.  At that time, I added to her medical regimen because  her blood pressure was out of control (Maxzide, plus increased atenolol  to 25).   In the interval, she has been doing fairly well.  She had an upper  respiratory infection it sounds like and then near the end of this  developed significant dizziness over 2 days.  She was seen by her  primary physician, placed on the medicines for dizziness and things  have resolved.  She stopped this.  She is doing okay.  She denies chest  pain.   The patient is thinking about going back on Ritalin because she is  having a lot of distraction.  She does have ADD.   CURRENT MEDICINES:  1. Lamictal 150.  2. Nexium daily.  3. Simvastatin 80.  4. Atenolol 25.  5. Maxzide 37.5/25.   PHYSICAL EXAMINATION:  GENERAL:  The patient is in no distress at rest.  VITAL SIGNS:  Blood pressure 137/64, pulse 57, sitting 133/69 pulse 65,  standing at 0 minutes 133/75 pulse 59, at 2 minutes 133/76 pulse 69, at  5 minutes 125/75, pulse 60.  The patient is asymptomatic throughout.  LUNGS:  Clear.  CARDIAC:  Regular rate and rhythm.  S1 and S2.  No S3.  No murmurs.  Grade 1-2/6 systolic murmur heard best at the base.  Early peaking.  ABDOMEN:  Benign.  EXTREMITIES:  No edema.   IMPRESSION:  1. Hypertension, better.  I would check a BMET today, continue.  2. Palpitations, denied.  3. Dyslipidemia.  I spoke to the patient on the phone after I got the      labs back. Her LipoMet from September 26, 2007 shows her total      cholesterol of 194, LDL of 124 with 1200 LDL particles, and HDL was      48.  I had recommended a dietary change and had a  possible Vytorin.      She was suppose to check on coverage for this, has not.  We will go      ahead and keep her on the same regimen for now.  She will check      with her insurance carriers regarding the price of Vytorin or      Zetia, consider adding since her diet has indiscretions, so we are      going to keep her on the simvastatin for now.   I will set followup in the spring.   ADDENDUM:  In relation to the Ritalin, if she were to try to agree with  a low dose, but very close monitoring of blood pressure, if it goes up,  I would discontinue.     Pricilla Riffle, MD, [facility]  Electronically Signed  PVR/MedQ  DD: 11/07/2007  DT: 11/07/2007  Job #: [id number]   cc:   Darius Bump, M.D.

## 2010-06-24 NOTE — Op Note (Signed)
NAMEJULI, [name]           ACCOUNT NO.:  [id number]   MEDICAL RECORD NO.:  [id number]          PATIENT TYPE:  AMB   LOCATION:  DSC                          FACILITY:  [facility]   PHYSICIAN:  Mila Homer. Sherlean Foot, M.D. DATE OF BIRTH:  03-15-1933   DATE OF PROCEDURE:  06/06/2006  DATE OF DISCHARGE:                               OPERATIVE REPORT   SURGEON:  Mila Homer. Sherlean Foot, M.D.   ASSISTANT:  None.   ANESTHESIA:  MAC.   PREOPERATIVE DIAGNOSIS:  Right knee osteoarthritis and lateral meniscus  tear.   POSTOPERATIVE DIAGNOSIS:  Right knee osteoarthritis and lateral meniscus  tear.   PROCEDURE:  Right knee arthroscopy with partial lateral meniscectomy,  microfracture and chondroplasty.   INDICATIONS FOR PROCEDURE:  The patient is a 77 year old with MRI  evidence of a chondromalacia and a lateral meniscus tear.  Informed  consent obtained.   DESCRIPTION OF PROCEDURE:  The patient was laid supine under MAC  anesthesia.  The right knee was prepped and draped in the usual sterile  fashion.  Inferolateral and inferomedial portals were created with a #11  blade, blunt trocar and cannula.  Diagnostic arthroscopy revealed  chondromalacia of the patella.  Chondroplasty was performed here.  I  then went into the medial compartment; it was completely normal.  The  patella was graded at grade II and grade III on the lateral facet,  really very minimal in the trochlea.  In the medial compartment, there  was no chondromalacia.  The ACL and PCL appeared normal.  I then went  into the lateral compartment; there was one area of flaking articular  cartilage down to bone.  I performed a chondroplasty here and then in  that same lateral compartment, there was a very complex posterior horn  lateral meniscus tear; it actually went all the way anterior through the  popliteus hiatus and was impinging in the joint.  I then used the  straight and upbiting basket forceps to perform an aggressive lateral  meniscectomy.  I took out I would say 75% of the lateral meniscus.  There was minimal chondromalacia on tibial plateau.  I then performed a  microfracture in the 1 x 1-cm area; this was in the weightbearing  portion of the lateral femoral condyle.  I then lavaged, closed with 4-0  nylon sutures, dressed with Xeroform, dressing sponge, sterile Webril  and Ace wrap.  I did infiltrate with 10 mL of Marcaine and morphine  mixture.           ______________________________  Mila Homer Sherlean Foot, M.D.     SDL/MEDQ  D:  06/06/2006  T:  06/06/2006  Job:  [id number]

## 2010-06-27 ENCOUNTER — Other Ambulatory Visit: Payer: Self-pay | Admitting: Dermatology

## 2010-07-08 ENCOUNTER — Encounter: Payer: Self-pay | Admitting: Internal Medicine

## 2010-07-11 ENCOUNTER — Telehealth: Payer: Self-pay | Admitting: Internal Medicine

## 2010-07-11 NOTE — Telephone Encounter (Signed)
RN left message to call back 

## 2010-07-11 NOTE — Telephone Encounter (Signed)
Pt heart is beating really fast and hard and she is going on vacation and wants to be seen today

## 2010-07-13 NOTE — Telephone Encounter (Signed)
Left message for pt to call Zyona Pettaway  

## 2010-07-18 NOTE — Telephone Encounter (Signed)
Left message for pt to call back  °

## 2010-07-21 NOTE — Telephone Encounter (Signed)
LMTCB- Will route to Dr. Tenny Craw and her RN.

## 2010-07-25 ENCOUNTER — Ambulatory Visit (INDEPENDENT_AMBULATORY_CARE_PROVIDER_SITE_OTHER): Payer: Medicare Other | Admitting: Internal Medicine

## 2010-07-25 ENCOUNTER — Encounter: Payer: Self-pay | Admitting: Internal Medicine

## 2010-07-25 DIAGNOSIS — E782 Mixed hyperlipidemia: Secondary | ICD-10-CM

## 2010-07-25 DIAGNOSIS — H9209 Otalgia, unspecified ear: Secondary | ICD-10-CM

## 2010-07-25 DIAGNOSIS — R002 Palpitations: Secondary | ICD-10-CM

## 2010-07-25 DIAGNOSIS — R0989 Other specified symptoms and signs involving the circulatory and respiratory systems: Secondary | ICD-10-CM

## 2010-07-25 DIAGNOSIS — I10 Essential (primary) hypertension: Secondary | ICD-10-CM

## 2010-07-25 DIAGNOSIS — E785 Hyperlipidemia, unspecified: Secondary | ICD-10-CM

## 2010-07-25 MED ORDER — ATENOLOL 25 MG PO TABS: ORAL_TABLET | ORAL | Status: DC

## 2010-07-25 NOTE — Progress Notes (Signed)
HPI  Patient is a 77 year old with a history of palpitations, hyperdynamic LV, dyslipidema and HTN. I saw her in clinic about 1 year ago. SInce seen she continues to have occasional palpitations.  Short lived.  Also not chronic throbbing (hearing her heart) in her L ear. She was seen by ENT.  Nothing found. Breathing is OK.  No chest pains. Allergies  Allergen Reactions  . Aspirin     Current Outpatient Prescriptions  Medication Sig Dispense Refill  . lamoTRIgine (LAMICTAL) 200 MG tablet Take 200 mg by mouth 2 (two) times daily.        Marland Kitchen omeprazole (PRILOSEC) 40 MG capsule daily      . simvastatin (ZOCOR) 80 MG tablet Take 80 mg by mouth at bedtime.        . triamterene-hydrochlorothiazide (MAXZIDE) 75-50 MG per tablet TAKE ONE-HALF TABLET BY MOUTH EVERY DAY  90 tablet  2  . DISCONTD: esomeprazole (NEXIUM) 40 MG capsule Take 40 mg by mouth daily before breakfast.        . atenolol (TENORMIN) 25 MG tablet 1 tab 2 times per day    60 tablet  11  . zoster vaccine live, PF, (ZOSTAVAX) 16109 UNT/0.65ML injection Inject 1 mL into the skin once.        Marland Kitchen DISCONTD: atenolol (TENORMIN) 25 MG tablet Take 25 mg by mouth daily. 1 tab daily      . DISCONTD: meclizine (ANTIVERT) 25 MG tablet Take 25 mg by mouth 4 (four) times daily as needed.          Past Medical History  Diagnosis Date  . Hypertension   . Dyslipidemia   . Palpitations     No past surgical history on file.  Family History  Problem Relation Age of Onset  . Heart disease Mother   . Depression Mother   . Cancer Mother     colon cancer  . Hypertension Father   . Heart disease Father   . Cancer Father     prostate cancer    History   Social History  . Marital Status: Married    Spouse Name: N/A    Number of Children: N/A  . Years of Education: N/A   Occupational History  . Not on file.   Social History Main Topics  . Smoking status: Never Smoker   . Smokeless tobacco: Not on file  . Alcohol Use: Not on file   . Drug Use: Not on file  . Sexually Active: Not on file   Other Topics Concern  . Not on file   Social History Narrative  . No narrative on file    Review of Systems:  All systems reviewed.  They are negative to the above problem except as previously stated.  Vital Signs: BP 144/80  Pulse 66  Ht 5\' 6"  (1.676 m)  Wt 149 lb (67.586 kg)  BMI 24.05 kg/m2  Physical Exam Patient is in NAD.  HEENT:  Normocephalic, atraumatic. EOMI, PERRLA.  Neck: JVP is normal. No thyromegaly. No bruits.  Lungs: clear to auscultation. No rales no wheezes.  Heart: Regular rate and rhythm. Normal S1, S2. No S3.   No significant murmurs. PMI not displaced.  Abdomen:  Supple, nontender. Normal bowel sounds. No masses. No hepatomegaly.  Extremities:   Good distal pulses throughout. No lower extremity edema.  Musculoskeletal :moving all extremities.  Neuro:   alert and oriented x3.  CN II-XII grossly intact.  EKG:  NSR.  66 bpm.  RBBB.  First degree AV block.   Assessment and Plan:

## 2010-07-25 NOTE — Assessment & Plan Note (Signed)
Adequate control. 

## 2010-07-25 NOTE — Patient Instructions (Signed)
Your physician has requested that you have a carotid duplex. This test is an ultrasound of the carotid arteries in your neck. It looks at blood flow through these arteries that supply the brain with blood. Allow one hour for this exam. There are no restrictions or special instructions.   FASTING LAB WORK DAY OF CAROTID ULTRASOUND.

## 2010-07-25 NOTE — Assessment & Plan Note (Signed)
Will get fasting lipids.

## 2010-07-25 NOTE — Assessment & Plan Note (Signed)
Patient with throbbing in her earl.  I do not hear a bruit over the L carotid.  BUt with her symptoms I would recomm a carotiD USN to r/o FMD of the carotid.

## 2010-07-25 NOTE — Assessment & Plan Note (Signed)
I am not convinced of signif arrhythmia.  Follow.

## 2010-08-04 ENCOUNTER — Other Ambulatory Visit: Payer: Self-pay | Admitting: Internal Medicine

## 2010-08-04 DIAGNOSIS — R0989 Other specified symptoms and signs involving the circulatory and respiratory systems: Secondary | ICD-10-CM

## 2010-08-05 ENCOUNTER — Other Ambulatory Visit: Payer: Medicare Other | Admitting: *Deleted

## 2010-08-05 ENCOUNTER — Encounter: Payer: Medicare Other | Admitting: *Deleted

## 2010-08-05 NOTE — Telephone Encounter (Signed)
Seen on 07/25/2010

## 2010-08-18 ENCOUNTER — Encounter: Payer: Medicare Other | Admitting: Cardiology

## 2010-08-23 ENCOUNTER — Other Ambulatory Visit (INDEPENDENT_AMBULATORY_CARE_PROVIDER_SITE_OTHER): Payer: Medicare Other | Admitting: *Deleted

## 2010-08-23 ENCOUNTER — Encounter (INDEPENDENT_AMBULATORY_CARE_PROVIDER_SITE_OTHER): Payer: Medicare Other | Admitting: *Deleted

## 2010-08-23 DIAGNOSIS — I6529 Occlusion and stenosis of unspecified carotid artery: Secondary | ICD-10-CM

## 2010-08-23 DIAGNOSIS — E785 Hyperlipidemia, unspecified: Secondary | ICD-10-CM

## 2010-08-23 DIAGNOSIS — R0989 Other specified symptoms and signs involving the circulatory and respiratory systems: Secondary | ICD-10-CM

## 2010-08-23 DIAGNOSIS — I10 Essential (primary) hypertension: Secondary | ICD-10-CM

## 2010-08-23 LAB — LIPID PANEL: Cholesterol: 161 mg/dL (ref 0–200)

## 2010-08-25 ENCOUNTER — Encounter: Payer: Self-pay | Admitting: Internal Medicine

## 2010-09-14 ENCOUNTER — Other Ambulatory Visit: Payer: Self-pay | Admitting: *Deleted

## 2010-09-14 DIAGNOSIS — R002 Palpitations: Secondary | ICD-10-CM

## 2010-09-14 MED ORDER — ATENOLOL 25 MG PO TABS: ORAL_TABLET | ORAL | Status: DC

## 2010-09-20 ENCOUNTER — Other Ambulatory Visit: Payer: Self-pay | Admitting: Gastroenterology

## 2010-09-20 DIAGNOSIS — R1012 Left upper quadrant pain: Secondary | ICD-10-CM

## 2010-09-21 ENCOUNTER — Ambulatory Visit
Admission: RE | Admit: 2010-09-21 | Discharge: 2010-09-21 | Disposition: A | Discharge status: Home / Self Care | Payer: Medicare Other | Source: Ambulatory Visit | Attending: Gastroenterology | Admitting: Gastroenterology

## 2010-09-21 DIAGNOSIS — R1012 Left upper quadrant pain: Secondary | ICD-10-CM

## 2010-09-21 MED ORDER — IOHEXOL 300 MG/ML  SOLN: 100.0000 mL | Freq: Once | INTRAMUSCULAR | Status: AC | PRN

## 2010-09-30 ENCOUNTER — Telehealth (INDEPENDENT_AMBULATORY_CARE_PROVIDER_SITE_OTHER): Payer: Self-pay | Admitting: General Surgery

## 2010-10-03 ENCOUNTER — Encounter (INDEPENDENT_AMBULATORY_CARE_PROVIDER_SITE_OTHER): Payer: Self-pay

## 2010-10-11 ENCOUNTER — Ambulatory Visit (INDEPENDENT_AMBULATORY_CARE_PROVIDER_SITE_OTHER): Payer: Medicare Other | Admitting: General Surgery

## 2010-10-11 ENCOUNTER — Encounter (INDEPENDENT_AMBULATORY_CARE_PROVIDER_SITE_OTHER): Payer: Self-pay | Admitting: General Surgery

## 2010-10-11 DIAGNOSIS — K388 Other specified diseases of appendix: Secondary | ICD-10-CM

## 2010-10-11 DIAGNOSIS — K389 Disease of appendix, unspecified: Secondary | ICD-10-CM

## 2010-10-11 DIAGNOSIS — K8020 Calculus of gallbladder without cholecystitis without obstruction: Secondary | ICD-10-CM

## 2010-10-11 NOTE — Progress Notes (Signed)
Chief Complaint  Patient presents with  . Other    eval of gallstones and appendix mucocele     HPI [name] is a 77 y.o. female.    HPI  The patient comes in after being referred by Dr. Chelsea Aus for gallstones and an appendiceal mucocele. She went to Dr. Loreta Ave because of abdominal pain predominantly in the left upper quadrant this is a piercing pain from the left upper quadrant to the back he usually last about an hour or so, not very severe. She had seen Dr. Barnett Abu in the past for similar pains and was known to have had gallstones approximately 5 years ago.  She has no right upper quadrant tenderness or pain no epigastric pain or tenderness no nausea vomiting jaundice or recent unexpected weight loss  As part of her evaluation she had a colonoscopy and an upper GI endoscopy and a CT scan. This demonstrates that she has no appendix is still with low attenuation material in the distal part looks somewhat lobulated with a diameter of 2 about 11 mm. This most likely represents a mucocele because of these findings and the patient's gallstones she was sent for surgical evaluation  Past Medical History  Diagnosis Date  . Hypertension   . Dyslipidemia   . Palpitations   . Depression   . Hyperlipidemia   . GERD (gastroesophageal reflux disease)   . Urinary incontinence   . Hiatal hernia   . Renal cysts, acquired, bilateral   . Cholelithiasis   . Renal angiomyolipoma   . Hemorrhoids   . Tubular adenoma   . Heart murmur   . Abdominal pain     Past Surgical History  Procedure Date  . Eye surgery     Family History  Problem Relation Age of Onset  . Heart disease Mother   . Depression Mother   . Cancer Mother     colon cancer  . Hypertension Father   . Heart disease Father   . Cancer Father     prostate cancer    Social History History  Substance Use Topics  . Smoking status: Never Smoker   . Smokeless tobacco: Not on file  . Alcohol Use: No    Allergies   Allergen Reactions  . Aspirin     Current Outpatient Prescriptions  Medication Sig Dispense Refill  . atenolol (TENORMIN) 25 MG tablet 1 tab 2 times per day    180 tablet  3  . lamoTRIgine (LAMICTAL) 200 MG tablet Take 200 mg by mouth 2 (two) times daily.        Marland Kitchen omeprazole (PRILOSEC) 40 MG capsule daily      . simvastatin (ZOCOR) 80 MG tablet Take 80 mg by mouth at bedtime.        . triamterene-hydrochlorothiazide (MAXZIDE) 75-50 MG per tablet TAKE ONE-HALF TABLET BY MOUTH EVERY DAY  90 tablet  2  . zoster vaccine live, PF, (ZOSTAVAX) 09811 UNT/0.65ML injection Inject 1 mL into the skin once.          Review of Systems Review of Systems  Constitutional: Positive for appetite change and unexpected weight change (10 pounds due to recent increased activity).  HENT: Negative.   Eyes: Negative.   Respiratory: Negative.   Cardiovascular: Positive for palpitations (occasional palpitations).  Gastrointestinal: Positive for abdominal pain (LUQ pain). Negative for nausea, vomiting, diarrhea, constipation, blood in stool and anal bleeding.  Genitourinary: Negative.   Musculoskeletal: Negative.     Blood pressure 148/78, pulse  60, temperature 97.5 F (36.4 C), height 5\' 6"  (1.676 m), weight 141 lb 8 oz (64.184 kg).  Physical Exam Physical Exam  Constitutional: She is oriented to person, place, and time. She appears well-developed and well-nourished.  HENT:  Head: Normocephalic and atraumatic.  Eyes: Conjunctivae and EOM are normal. Pupils are equal, round, and reactive to light.  Neck: Normal range of motion. Neck supple. No JVD present. No tracheal deviation present.  Cardiovascular: Normal rate and regular rhythm.   Pulmonary/Chest: Effort normal and breath sounds normal.  Abdominal: Soft. Bowel sounds are normal. She exhibits no distension and no mass. There is no tenderness. There is no rebound and no guarding.  Musculoskeletal: Normal range of motion. She exhibits no edema.    Lymphadenopathy:    She has no cervical adenopathy.  Neurological: She is alert and oriented to person, place, and time.  Skin: Skin is warm and dry.  Psychiatric: She has a normal mood and affect. Her behavior is normal. Thought content normal.    Data Reviewed I have reviewed the information sent over by Dr. Kenna Gilbert office. This includes information from the colonoscopy and upper GI endoscopy and also for CT scan. Assessment    The patient has gallstones which may or may not be symptomatic based on whether or not her left upper quadrant tenderness and occasional discomfort is secondary to her gallstones. 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    Plan    The plan is to perform a laparoscopic appendectomy and cholecystectomy with likely cholangiogram at the time of surgery.  I've explained the risk and benefits of the procedure to the patient who wishes to proceed along with several family members were present at the time    Meztli Llanas III,Cait Locust O 10/11/2010, 12:04 PM

## 2010-10-18 ENCOUNTER — Encounter (HOSPITAL_COMMUNITY): Payer: Medicare Other

## 2010-10-18 ENCOUNTER — Other Ambulatory Visit (INDEPENDENT_AMBULATORY_CARE_PROVIDER_SITE_OTHER): Payer: Self-pay | Admitting: General Surgery

## 2010-10-18 ENCOUNTER — Ambulatory Visit (HOSPITAL_COMMUNITY)
Admission: RE | Admit: 2010-10-18 | Discharge: 2010-10-18 | Disposition: A | Discharge status: Home / Self Care | Payer: Medicare Other | Source: Ambulatory Visit | Attending: General Surgery | Admitting: General Surgery

## 2010-10-18 DIAGNOSIS — Z01812 Encounter for preprocedural laboratory examination: Secondary | ICD-10-CM | POA: Insufficient documentation

## 2010-10-18 DIAGNOSIS — Z01818 Encounter for other preprocedural examination: Secondary | ICD-10-CM | POA: Insufficient documentation

## 2010-10-18 DIAGNOSIS — I517 Cardiomegaly: Secondary | ICD-10-CM | POA: Insufficient documentation

## 2010-10-18 DIAGNOSIS — Z01810 Encounter for preprocedural cardiovascular examination: Secondary | ICD-10-CM | POA: Insufficient documentation

## 2010-10-18 DIAGNOSIS — K8020 Calculus of gallbladder without cholecystitis without obstruction: Secondary | ICD-10-CM

## 2010-10-18 LAB — DIFFERENTIAL
Basophils Relative: 0 % (ref 0–1)
Eosinophils Absolute: 0.2 10*3/uL (ref 0.0–0.7)
Lymphs Abs: 2 10*3/uL (ref 0.7–4.0)
Monocytes Relative: 8 % (ref 3–12)
Neutro Abs: 4.1 10*3/uL (ref 1.7–7.7)
Neutrophils Relative %: 60 % (ref 43–77)

## 2010-10-18 LAB — COMPREHENSIVE METABOLIC PANEL
ALT: 14 U/L (ref 0–35)
Albumin: 3.3 g/dL — ABNORMAL LOW (ref 3.5–5.2)
Alkaline Phosphatase: 81 U/L (ref 39–117)
Chloride: 104 mEq/L (ref 96–112)
Glucose, Bld: 93 mg/dL (ref 70–99)
Potassium: 4.2 mEq/L (ref 3.5–5.1)
Sodium: 140 mEq/L (ref 135–145)
Total Bilirubin: 0.3 mg/dL (ref 0.3–1.2)
Total Protein: 6.3 g/dL (ref 6.0–8.3)

## 2010-10-18 LAB — CBC
Hemoglobin: 13.9 g/dL (ref 12.0–15.0)
MCH: 29.3 pg (ref 26.0–34.0)
Platelets: 310 10*3/uL (ref 150–400)
RBC: 4.74 MIL/uL (ref 3.87–5.11)

## 2010-10-25 ENCOUNTER — Ambulatory Visit (HOSPITAL_COMMUNITY)
Admission: RE | Admit: 2010-10-25 | Discharge: 2010-10-25 | Disposition: A | Discharge status: Home / Self Care | Payer: Medicare Other | Source: Ambulatory Visit | Attending: General Surgery | Admitting: General Surgery

## 2010-10-25 ENCOUNTER — Other Ambulatory Visit (INDEPENDENT_AMBULATORY_CARE_PROVIDER_SITE_OTHER): Payer: Self-pay | Admitting: General Surgery

## 2010-10-25 DIAGNOSIS — K8010 Calculus of gallbladder with chronic cholecystitis without obstruction: Secondary | ICD-10-CM | POA: Insufficient documentation

## 2010-10-25 DIAGNOSIS — D371 Neoplasm of uncertain behavior of stomach: Secondary | ICD-10-CM

## 2010-10-25 DIAGNOSIS — D378 Neoplasm of uncertain behavior of other specified digestive organs: Secondary | ICD-10-CM

## 2010-10-25 DIAGNOSIS — Z01818 Encounter for other preprocedural examination: Secondary | ICD-10-CM | POA: Insufficient documentation

## 2010-10-25 DIAGNOSIS — Z01812 Encounter for preprocedural laboratory examination: Secondary | ICD-10-CM | POA: Insufficient documentation

## 2010-10-25 DIAGNOSIS — D126 Benign neoplasm of colon, unspecified: Secondary | ICD-10-CM | POA: Insufficient documentation

## 2010-10-25 DIAGNOSIS — R1012 Left upper quadrant pain: Secondary | ICD-10-CM | POA: Insufficient documentation

## 2010-10-25 DIAGNOSIS — Z01810 Encounter for preprocedural cardiovascular examination: Secondary | ICD-10-CM | POA: Insufficient documentation

## 2010-10-25 DIAGNOSIS — D375 Neoplasm of uncertain behavior of rectum: Secondary | ICD-10-CM

## 2010-10-25 HISTORY — PX: CHOLECYSTECTOMY: SHX55

## 2010-10-25 HISTORY — PX: APPENDECTOMY: SHX54

## 2010-10-27 NOTE — Op Note (Signed)
NAMECYD, HOSTLER NO.:  [id number]  MEDICAL RECORD NO.:  [id number]  LOCATION:  DAYL                         FACILITY:  [facility]  PHYSICIAN:  Cherylynn Ridges, M.D.    DATE OF BIRTH:  1933-05-28  DATE OF PROCEDURE: DATE OF DISCHARGE:                              OPERATIVE REPORT   PREOPERATIVE DIAGNOSIS:  Likely symptomatic gallstones with the mucocele of the appendix.  POSTOPERATIVE DIAGNOSIS:  Likely symptomatic gallstones with the mucocele of the appendix.  PROCEDURE.: 1. Laparoscopic appendectomy. 2. Laparoscopic cholecystectomy.  SURGEON:  Marta Lamas. Lindie Spruce, MD  ANESTHESIA:  General endotracheal.  ESTIMATED BLOOD LOSS:  Less than 20 cc.  COMPLICATIONS:  No complications.  CONDITION:  Stable.  FINDINGS: 1. A very fat appendix, especially at the tip without evidence of     local perforation or invasion.  No evidence of malignancy. 2. Chronic cholecystitis with gallstones.  INDICATIONS FOR OPERATION:  The patient is a 77 year old with left upper quadrant pain and gallstones on an ultrasound, also found to have a dilated and fluid-filled appendix, likely a mucocele, who comes in now for an elective laparoscopic appendectomy and cholecystectomy.  OPERATION IN DETAIL:  The patient was taken to the operating room, placed on table in supine position.  After an adequate general endotracheal anesthetic was administered, she was prepped and draped in usual sterile manner, exposing the entire abdomen.  After proper time-out was performed, identifying the patient and the procedures to be performed, infraumbilical midline incision was made approximately 1.5 cm long down to the midline fascia.  We grabbed the fascia with Kocher clamps and incised between the clamps using a #15 blade.  As we tented up on the edges of the fascia, we bluntly dissected down through the preperitoneal space using a Kelly clamp into the peritoneal cavity.  A purse-string suture of  0 Vicryl was passed around the fascial opening, was secured in a Hasson cannula.  Once this was done, carbon dioxide gas was insufflated into the peritoneal cavity up to a maximal pressure of 15 mmHg.  Once this was done, the patient was placed in Trendelenburg, left side was tilted down.  Two right upper quadrant 5 mm cannulas passed under direct vision.  Once they were in place, we were able to dissect out the appendix.  The appendix was not attached to any local structures.  We were able to mobilize it at its base and the mesoappendix using a [place] and a grasper.  We were able to isolate the mesoappendix away from the base of the appendix at the cecum and come across each structure using an Endo GIA 3.5 mm closure stapler across the base of the appendix, a 2.5 mm closure across the mesoappendix.  There was a mild bleeding from the mesoappendix once this was done, which we cauterized.  Once the bleeding was controlled, we aspirated the fluid and irrigated that area and there was no further bleeding noted.  We removed the appendix from the infraumbilical cannula site using an EndoCatch bag.  Once this was done, we focused our attention on the patient's gallbladder.  We did inspect briefly the left upper quadrant where the patient seemed to have most  of her discomfort and found there to be no anatomical abnormality.  We grabbed the gallbladder with a grasper and retracted towards the anterior abdominal wall and the right upper quadrant.  A second grasper was passed onto the infundibulum.  We dissected out the peritoneum overlying the triangle of Calot and the hepatoduodenal triangle.  We isolated the cystic duct and the cystic artery, and had a 360-degree run around both structures.  We clipped the cystic duct down towards the common bile duct x3, then across the gallbladder side x1 and transected it.  We clipped the cystic artery proximally and distally x2 and then  transected it.  We dissected out the gallbladder from its bed with minimal difficulty without entrance into the gallbladder and we were able to retrieve it from the infraumbilical site using a grasper.  All counts were correct at that point.  We inspected the bed of the liver and found there to be no bleeding and no bile staining.  We aspirated all fluid and gas from above the liver and then removed all cannulas.  The infraumbilical fascial site was closed using a purse-string suture which was in place.  We injected 0.25% Marcaine with epi at all sites and then closed the infraumbilical and subxiphoid sites using running subcuticular stitch of 4-0 Monocryl.  A 10-11 mm cannula had been passed at the subxiphoid site.  Once the skin was closed, Dermabond, Steri- Strips, and Tegaderm used to complete all dressing.  All needle counts, sponge counts, and instrument counts were correct.     Cherylynn Ridges, M.D.     JOW/MEDQ  D:  10/25/2010  T:  10/25/2010  Job:  [id number]  cc:   Anselmo Rod, MD, [facility] Fax: [phone]  Electronically Signed by Jimmye Norman M.D. on 10/27/2010 04:44:14 AM

## 2010-11-11 ENCOUNTER — Encounter (INDEPENDENT_AMBULATORY_CARE_PROVIDER_SITE_OTHER): Payer: Medicare Other | Admitting: General Surgery

## 2010-11-22 ENCOUNTER — Encounter (INDEPENDENT_AMBULATORY_CARE_PROVIDER_SITE_OTHER): Payer: Self-pay | Admitting: General Surgery

## 2010-11-22 ENCOUNTER — Ambulatory Visit (INDEPENDENT_AMBULATORY_CARE_PROVIDER_SITE_OTHER): Payer: Medicare Other | Admitting: General Surgery

## 2010-11-22 DIAGNOSIS — Z09 Encounter for follow-up examination after completed treatment for conditions other than malignant neoplasm: Secondary | ICD-10-CM

## 2010-11-22 NOTE — Patient Instructions (Signed)
Give pain in upper back time to resolve.

## 2010-11-22 NOTE — Progress Notes (Signed)
HPI The patient comes in status post laparoscopic cholecystectomy was doing well. The only complaint she has has occasional left upper back pain that tends to be subsiding over time  PE Her wounds are healing well with no evidence of infection. She has no abdominal tenderness.  Studiy review No  Assessment Doing well status post laparoscopic cholecystectomy  Plan Return to see me on a p.r.n. basis.

## 2010-12-06 ENCOUNTER — Other Ambulatory Visit: Payer: Self-pay | Admitting: Obstetrics and Gynecology

## 2010-12-06 DIAGNOSIS — Z1231 Encounter for screening mammogram for malignant neoplasm of breast: Secondary | ICD-10-CM

## 2010-12-08 ENCOUNTER — Other Ambulatory Visit: Payer: Self-pay | Admitting: Family Medicine

## 2010-12-09 NOTE — Telephone Encounter (Signed)
Last seen 07/02/08   Please advise     KP

## 2010-12-28 ENCOUNTER — Ambulatory Visit
Admission: RE | Admit: 2010-12-28 | Discharge: 2010-12-28 | Disposition: A | Discharge status: Home / Self Care | Payer: Medicare Other | Source: Ambulatory Visit | Attending: Obstetrics and Gynecology | Admitting: Obstetrics and Gynecology

## 2010-12-28 DIAGNOSIS — Z1231 Encounter for screening mammogram for malignant neoplasm of breast: Secondary | ICD-10-CM

## 2011-01-17 ENCOUNTER — Other Ambulatory Visit: Payer: Self-pay | Admitting: Otolaryngology

## 2011-01-21 ENCOUNTER — Ambulatory Visit
Admission: RE | Admit: 2011-01-21 | Discharge: 2011-01-21 | Disposition: A | Discharge status: Home / Self Care | Payer: Medicare Other | Source: Ambulatory Visit | Attending: Otolaryngology | Admitting: Otolaryngology

## 2011-02-03 ENCOUNTER — Other Ambulatory Visit: Payer: Self-pay | Admitting: Family Medicine

## 2011-02-27 ENCOUNTER — Other Ambulatory Visit: Payer: Self-pay | Admitting: Family Medicine

## 2011-05-15 ENCOUNTER — Other Ambulatory Visit: Payer: Self-pay | Admitting: Internal Medicine

## 2011-06-20 ENCOUNTER — Encounter: Payer: Self-pay | Admitting: Family Medicine

## 2011-06-20 ENCOUNTER — Ambulatory Visit (INDEPENDENT_AMBULATORY_CARE_PROVIDER_SITE_OTHER): Payer: Medicare Other | Admitting: Family Medicine

## 2011-06-20 VITALS — BP 116/70 | HR 70 | Temp 97.7°F | Ht 65.0 in | Wt 147.2 lb

## 2011-06-20 DIAGNOSIS — I10 Essential (primary) hypertension: Secondary | ICD-10-CM

## 2011-06-20 DIAGNOSIS — E785 Hyperlipidemia, unspecified: Secondary | ICD-10-CM

## 2011-06-20 DIAGNOSIS — F319 Bipolar disorder, unspecified: Secondary | ICD-10-CM

## 2011-06-20 LAB — HEPATIC FUNCTION PANEL
ALT: 15 U/L (ref 0–35)
AST: 22 U/L (ref 0–37)
Albumin: 3.6 g/dL (ref 3.5–5.2)
Alkaline Phosphatase: 71 U/L (ref 39–117)
Bilirubin, Direct: 0 mg/dL (ref 0.0–0.3)
Total Bilirubin: 0.4 mg/dL (ref 0.3–1.2)
Total Protein: 6.5 g/dL (ref 6.0–8.3)

## 2011-06-20 LAB — BASIC METABOLIC PANEL WITH GFR
BUN: 17 mg/dL (ref 6–23)
CO2: 30 meq/L (ref 19–32)
Calcium: 9 mg/dL (ref 8.4–10.5)
Chloride: 104 meq/L (ref 96–112)
Creatinine, Ser: 0.9 mg/dL (ref 0.4–1.2)
GFR: 69.09 mL/min (ref >60.00)
Glucose, Bld: 92 mg/dL (ref 70–99)
Potassium: 3.8 meq/L (ref 3.5–5.1)
Sodium: 142 meq/L (ref 135–145)

## 2011-06-20 LAB — CBC WITH DIFFERENTIAL/PLATELET
Basophils Absolute: 0.1 K/uL (ref 0.0–0.1)
Basophils Relative: 0.8 % (ref 0.0–3.0)
Eosinophils Absolute: 0.1 K/uL (ref 0.0–0.7)
Eosinophils Relative: 2.1 % (ref 0.0–5.0)
HCT: 41.3 % (ref 36.0–46.0)
Hemoglobin: 13.8 g/dL (ref 12.0–15.0)
Lymphocytes Relative: 29.9 % (ref 12.0–46.0)
Lymphs Abs: 1.9 K/uL (ref 0.7–4.0)
MCHC: 33.3 g/dL (ref 30.0–36.0)
MCV: 88.9 fl (ref 78.0–100.0)
Monocytes Absolute: 0.4 K/uL (ref 0.1–1.0)
Monocytes Relative: 6.4 % (ref 3.0–12.0)
Neutro Abs: 3.9 K/uL (ref 1.4–7.7)
Neutrophils Relative %: 60.8 % (ref 43.0–77.0)
Platelets: 303 K/uL (ref 150.0–400.0)
RBC: 4.65 Mil/uL (ref 3.87–5.11)
RDW: 14 % (ref 11.5–14.6)
WBC: 6.4 K/uL (ref 4.5–10.5)

## 2011-06-20 LAB — LIPID PANEL
LDL Cholesterol: 83 mg/dL (ref 0–99)
Total CHOL/HDL Ratio: 3
VLDL: 32.2 mg/dL (ref 0.0–40.0)

## 2011-06-20 MED ORDER — ATENOLOL 25 MG PO TABS: ORAL_TABLET | ORAL | Status: DC

## 2011-06-20 MED ORDER — TRIAMTERENE-HCTZ 37.5-25 MG PO TABS: 1.0000 | ORAL_TABLET | Freq: Every day | ORAL | Status: DC

## 2011-06-20 MED ORDER — LAMOTRIGINE 200 MG PO TABS: 200.0000 mg | ORAL_TABLET | Freq: Two times a day (BID) | ORAL | Status: DC

## 2011-06-20 NOTE — Patient Instructions (Signed)

## 2011-06-20 NOTE — Progress Notes (Signed)
Addended by: Silvio Pate D on: 06/20/2011 02:01 PM   Modules accepted: Orders

## 2011-06-20 NOTE — Progress Notes (Signed)
  S                              ubjective:    Patient here for follow-up of elevated blood pressure.  She is exercising and is adherent to a low-salt diet.  Blood pressure is well controlled at home. Cardiac symptoms: none. Patient denies: chest pain, chest pressure/discomfort, claudication, dyspnea, exertional chest pressure/discomfort, fatigue, irregular heart beat, lower extremity edema, near-syncope, orthopnea, palpitations, paroxysmal nocturnal dyspnea, syncope and tachypnea. Cardiovascular risk factors: advanced age (older than 40 for men, 12 for women), dyslipidemia and hypertension. Use of agents associated with hypertension: none. History of target organ damage: none.  The following portions of the patient's history were reviewed and updated as appropriate: allergies, current medications, past family history, past medical history, past social history, past surgical history and problem list.  Review of Systems Pertinent items are noted in HPI.     Objective:    BP 116/70  Pulse 70  Temp(Src) 97.7 F (36.5 C) (Oral)  Ht 5\' 5"  (1.651 m)  Wt 147 lb 3.2 oz (66.769 kg)  BMI 24.50 kg/m2  SpO2 98% General appearance: alert, cooperative, appears stated age and no distress Lungs: clear to auscultation bilaterally Heart: S1, S2 normal and murmur  2/6 Extremities: extremities normal, atraumatic, no cyanosis or edema    Assessment:    Hypertension, normal blood pressure . Evidence of target organ damage: none.   hyperlipidemia---check labs Bipolar--refill meds-- stable Plan:    Medication: decrease maxide. Dietary sodium restriction. Regular aerobic exercise. Check blood pressures 2-3 times weekly and record. Follow up: 6 months and as needed.

## 2011-11-14 ENCOUNTER — Other Ambulatory Visit: Payer: Self-pay | Admitting: Family Medicine

## 2011-11-14 MED ORDER — VALACYCLOVIR HCL 1 G PO TABS: 1000.0000 mg | ORAL_TABLET | Freq: Three times a day (TID) | ORAL | Status: DC

## 2011-11-14 NOTE — Telephone Encounter (Signed)
pt wants to know if dr.lowne will call in Valtrex for her fever blisters? She has gotten medicine before but not from dr.lowne, advised pt she would need to be seen, she stated she wanted me to ask before she made an appt if you would just call in.  Cb# [phone] Use [facility] on [address] or [address] per patient, demographics show  [id number] - [place] ([place]), [place] - [address]

## 2011-11-14 NOTE — Telephone Encounter (Signed)
Valtrex 1000mg   1 po tid for 7 days #21----ov if no better

## 2011-11-14 NOTE — Telephone Encounter (Signed)
Please advise      KP 

## 2011-11-14 NOTE — Telephone Encounter (Signed)
Patient aware and Rx has been sent to the [facility] on [address]---   KP

## 2011-11-15 ENCOUNTER — Other Ambulatory Visit: Payer: Self-pay

## 2011-11-15 ENCOUNTER — Telehealth: Payer: Self-pay

## 2011-11-15 NOTE — Telephone Encounter (Signed)
Pt stated ask for Rx to be sent to [address] [facility] but not there. I gave pt address and number pt stated she was going to call and see if it was there.     MW

## 2011-11-15 NOTE — Telephone Encounter (Signed)
Pt stated Rx wasn't at pharmacy. I called pharmacy to do telephone order for Rx. Left message letting pt know pharmacy stated Rx would be ready by 2:30pm.         MW

## 2011-12-05 ENCOUNTER — Other Ambulatory Visit (INDEPENDENT_AMBULATORY_CARE_PROVIDER_SITE_OTHER): Payer: Self-pay

## 2011-12-05 ENCOUNTER — Ambulatory Visit (INDEPENDENT_AMBULATORY_CARE_PROVIDER_SITE_OTHER): Payer: Medicare Other | Admitting: General Surgery

## 2011-12-05 ENCOUNTER — Telehealth (INDEPENDENT_AMBULATORY_CARE_PROVIDER_SITE_OTHER): Payer: Self-pay

## 2011-12-05 ENCOUNTER — Encounter (INDEPENDENT_AMBULATORY_CARE_PROVIDER_SITE_OTHER): Payer: Self-pay | Admitting: General Surgery

## 2011-12-05 VITALS — BP 140/74 | HR 60 | Temp 97.6°F | Ht 66.0 in | Wt 138.8 lb

## 2011-12-05 DIAGNOSIS — R1012 Left upper quadrant pain: Secondary | ICD-10-CM

## 2011-12-05 NOTE — Progress Notes (Signed)
The patient comes in because of left upper quadrant discomfort and left upper back discomfort and continuing diarrhea up to 3-4 times a day.  On examination she has no palpable masses. She is mildly tender in the left upper quadrant.  I performed a laparoscopic cholecystectomy and appendectomy on the patient. This problem does not appear to be related to the surgery.  I did tell the patient and she continued to have problems with pain in that area that she should come back to see me. We will go ahead and get a CT scan of her abdomen and pelvis and see the patient in 3-4 weeks.

## 2011-12-05 NOTE — Telephone Encounter (Signed)
Called patient to give her CT appointment. No answer; left message to call back. She is scheduled for Monday November 4th with arrival time of 10:15. She will need lab work prior (BMET orders in computer). If she was not given contrast in office she will need to pick some up. First bottle @ 8:30 2nd bottle @ 9:30 and NPO 4 hrs prior to 10:30.

## 2011-12-05 NOTE — Addendum Note (Signed)
Addended by: Brennan Bailey on: 12/05/2011 01:56 PM   Modules accepted: Orders

## 2011-12-06 ENCOUNTER — Telehealth (INDEPENDENT_AMBULATORY_CARE_PROVIDER_SITE_OTHER): Payer: Self-pay

## 2011-12-06 NOTE — Telephone Encounter (Signed)
Called patient on both numbers so I could give her appointment info. No answer, left message to call me back.

## 2011-12-07 NOTE — Telephone Encounter (Signed)
Pt called and was given appt date and for her CT scan.  She was given instructions to p/u contrast and have labs drawn prior at [facility].

## 2011-12-08 ENCOUNTER — Other Ambulatory Visit (INDEPENDENT_AMBULATORY_CARE_PROVIDER_SITE_OTHER): Payer: Self-pay | Admitting: General Surgery

## 2011-12-11 ENCOUNTER — Ambulatory Visit
Admission: RE | Admit: 2011-12-11 | Discharge: 2011-12-11 | Disposition: A | Discharge status: Home / Self Care | Payer: Medicare Other | Source: Ambulatory Visit | Attending: General Surgery | Admitting: General Surgery

## 2011-12-11 DIAGNOSIS — R1012 Left upper quadrant pain: Secondary | ICD-10-CM

## 2011-12-11 MED ORDER — IOHEXOL 300 MG/ML  SOLN
100.0000 mL | Freq: Once | INTRAMUSCULAR | Status: AC | PRN
  Administered 2011-12-11: 100 mL via INTRAVENOUS

## 2011-12-12 ENCOUNTER — Other Ambulatory Visit: Payer: Self-pay | Admitting: Family Medicine

## 2011-12-12 ENCOUNTER — Other Ambulatory Visit: Payer: Self-pay | Admitting: Obstetrics and Gynecology

## 2011-12-12 DIAGNOSIS — Z1231 Encounter for screening mammogram for malignant neoplasm of breast: Secondary | ICD-10-CM

## 2011-12-13 ENCOUNTER — Other Ambulatory Visit: Payer: Self-pay | Admitting: Internal Medicine

## 2011-12-22 ENCOUNTER — Other Ambulatory Visit: Payer: Self-pay | Admitting: Internal Medicine

## 2011-12-29 ENCOUNTER — Telehealth (INDEPENDENT_AMBULATORY_CARE_PROVIDER_SITE_OTHER): Payer: Self-pay

## 2011-12-29 NOTE — Telephone Encounter (Signed)
Called patient to give her imaging results. Results are negative. Everything looks normal. No answer left message to call back.

## 2011-12-29 NOTE — Telephone Encounter (Signed)
Message copied by Brennan Bailey on Fri Dec 29, 2011 10:01 AM ------      Message from: Leanne Chang      Created: Tue Dec 26, 2011 10:36 AM      Regarding: Dr Con Memos: [phone]       Want results of Scan

## 2012-01-09 ENCOUNTER — Encounter (INDEPENDENT_AMBULATORY_CARE_PROVIDER_SITE_OTHER): Payer: Self-pay | Admitting: General Surgery

## 2012-01-09 ENCOUNTER — Ambulatory Visit (INDEPENDENT_AMBULATORY_CARE_PROVIDER_SITE_OTHER): Payer: Medicare Other | Admitting: General Surgery

## 2012-01-09 VITALS — BP 110/66 | HR 68 | Temp 97.2°F | Resp 16 | Ht 66.0 in | Wt 141.6 lb

## 2012-01-09 DIAGNOSIS — M546 Pain in thoracic spine: Secondary | ICD-10-CM

## 2012-01-09 DIAGNOSIS — M549 Dorsalgia, unspecified: Secondary | ICD-10-CM

## 2012-01-09 NOTE — Progress Notes (Signed)
The patient states that for about 2 weeks after she did her CT scan with the oral contrast her pain was relieved. Currently she continues to have left upper quadrant discomfort that radiates to her upper mid back. It still is mostly on the left side.  I am unsure what is causing her pain. It does not appear to be related to her surgery. She is return to see me on a when necessary basis but should possibly followup with her primary care physician for further workup.  The patient states that she called our office for results of the CT scan. An attempt was made to call her back however a message was left after there was no answer.

## 2012-01-15 ENCOUNTER — Encounter: Payer: Self-pay | Admitting: Internal Medicine

## 2012-01-15 ENCOUNTER — Ambulatory Visit (INDEPENDENT_AMBULATORY_CARE_PROVIDER_SITE_OTHER): Payer: Medicare Other | Admitting: Internal Medicine

## 2012-01-15 VITALS — BP 122/60 | HR 56 | Ht 66.0 in | Wt 144.0 lb

## 2012-01-15 DIAGNOSIS — E7800 Pure hypercholesterolemia, unspecified: Secondary | ICD-10-CM

## 2012-01-15 DIAGNOSIS — I10 Essential (primary) hypertension: Secondary | ICD-10-CM

## 2012-01-15 MED ORDER — ATENOLOL 25 MG PO TABS: ORAL_TABLET | ORAL | Status: DC

## 2012-01-15 MED ORDER — SIMVASTATIN 80 MG PO TABS: 80.0000 mg | ORAL_TABLET | Freq: Every day | ORAL | Status: DC

## 2012-01-15 MED ORDER — SIMVASTATIN 80 MG PO TABS: ORAL_TABLET | ORAL | Status: DC

## 2012-01-15 NOTE — Patient Instructions (Signed)
Take Atenolol one half 2 times per day. Your physician wants you to follow-up in: 18 months. You will receive a reminder letter in the mail two months in advance. If you don't receive a letter, please call our office to schedule the follow-up appointment.

## 2012-01-15 NOTE — Progress Notes (Signed)
HPI Patient is a 78 yo with a history of HTN, hyperdynamic LV, palpitations and HL   I saw her in 6/12.   LDL is 83, HDL is 54 in May.   Does note dizziness with standing   No syncope. SOme palpitations at night.  Not at other times.  No dizziness with these.  Allergies  Allergen Reactions  . Aspirin Anaphylaxis    Current Outpatient Prescriptions  Medication Sig Dispense Refill  . atenolol (TENORMIN) 25 MG tablet 1 po bid  180 tablet  3  . lamoTRIgine (LAMICTAL) 200 MG tablet Take 1 tablet (200 mg total) by mouth 2 (two) times daily.  180 tablet  3  . omeprazole (PRILOSEC) 40 MG capsule daily      . simvastatin (ZOCOR) 80 MG tablet TAKE ONE TABLET BY MOUTH AT BEDTIME  30 tablet  6  . triamterene-hydrochlorothiazide (MAXZIDE) 75-50 MG per tablet Take 1 tablet by mouth daily.  90 tablet  0  . triamterene-hydrochlorothiazide (MAXZIDE-25) 37.5-25 MG per tablet Take 1 each (1 tablet total) by mouth daily.  90 tablet  3  . valACYclovir (VALTREX) 1000 MG tablet Take 1 tablet (1,000 mg total) by mouth 3 (three) times daily.  21 tablet  0    Past Medical History  Diagnosis Date  . Hypertension   . Dyslipidemia   . Palpitations   . Depression   . Hyperlipidemia   . GERD (gastroesophageal reflux disease)   . Urinary incontinence   . Hiatal hernia   . Renal cysts, acquired, bilateral   . Cholelithiasis   . Renal angiomyolipoma   . Hemorrhoids   . Tubular adenoma   . Heart murmur   . Abdominal pain     Past Surgical History  Procedure Date  . Eye surgery   . Appendectomy 10/25/10  . Cholecystectomy 10/25/10    Family History  Problem Relation Age of Onset  . Heart disease Mother   . Depression Mother   . Cancer Mother     colon cancer  . Hypertension Father   . Heart disease Father   . Cancer Father     prostate cancer    History   Social History  . Marital Status: Married    Spouse Name: N/A    Number of Children: N/A  . Years of Education: N/A   Occupational  History  . Not on file.   Social History Main Topics  . Smoking status: Never Smoker   . Smokeless tobacco: Never Used  . Alcohol Use: No  . Drug Use: No  . Sexually Active: Not on file   Other Topics Concern  . Not on file   Social History Narrative  . No narrative on file    Review of Systems:  All systems reviewed.  They are negative to the above problem except as previously stated.  Vital Signs: BP 122/80   P 56.  Wt 144  Physical Exam Patient is in NAD HEENT:  Normocephalic, atraumatic. EOMI, PERRLA.  Neck: JVP is normal.  No bruits.  Lungs: clear to auscultation. No rales no wheezes.  Heart: Regular rate and rhythm. Normal S1, S2. No S3.   No significant murmurs. PMI not displaced.  Abdomen:  Supple, nontender. Normal bowel sounds. No masses. No hepatomegaly.  Extremities:   Good distal pulses throughout. No lower extremity edema.  Musculoskeletal :moving all extremities.  Neuro:   alert and oriented x3.  CN II-XII grossly intact.  EKG  SB  56.  First degree AV block  224 msec Assessment and Plan:  1.  Palpitations.  I do not think these represent signif arrhythmia.  Continue on b blocker  Taking in AM   Can switch to 1/2 bid  2.  HL  Good control  Would cut back to 40 though given recomm  F/U in spring by primary MD  Is primary prevention  3.  Dizziness.  I am not convinced of signif orthostasis.  Follow  Stay hydrated.  F/U in 18 months.

## 2012-01-19 ENCOUNTER — Ambulatory Visit
Admission: RE | Admit: 2012-01-19 | Discharge: 2012-01-19 | Disposition: A | Discharge status: Home / Self Care | Payer: Medicare Other | Source: Ambulatory Visit | Attending: Family Medicine | Admitting: Family Medicine

## 2012-01-19 DIAGNOSIS — Z1231 Encounter for screening mammogram for malignant neoplasm of breast: Secondary | ICD-10-CM

## 2012-03-11 ENCOUNTER — Telehealth: Payer: Self-pay | Admitting: Internal Medicine

## 2012-03-11 NOTE — Telephone Encounter (Signed)
New Problem     Pt is awaiting results from blood work from a few weeks ago. Would like to speak to nurse.

## 2012-03-14 ENCOUNTER — Ambulatory Visit (INDEPENDENT_AMBULATORY_CARE_PROVIDER_SITE_OTHER): Payer: Medicare Other | Admitting: Family Medicine

## 2012-03-14 ENCOUNTER — Encounter: Payer: Self-pay | Admitting: Family Medicine

## 2012-03-14 VITALS — BP 134/76 | HR 62 | Temp 97.5°F | Wt 146.6 lb

## 2012-03-14 DIAGNOSIS — H9319 Tinnitus, unspecified ear: Secondary | ICD-10-CM

## 2012-03-14 DIAGNOSIS — I10 Essential (primary) hypertension: Secondary | ICD-10-CM

## 2012-03-14 DIAGNOSIS — Z8619 Personal history of other infectious and parasitic diseases: Secondary | ICD-10-CM

## 2012-03-14 DIAGNOSIS — H9190 Unspecified hearing loss, unspecified ear: Secondary | ICD-10-CM

## 2012-03-14 LAB — CBC WITH DIFFERENTIAL/PLATELET
Basophils Absolute: 0 10*3/uL (ref 0.0–0.1)
Basophils Relative: 0.5 % (ref 0.0–3.0)
HCT: 40.8 % (ref 36.0–46.0)
Hemoglobin: 13.3 g/dL (ref 12.0–15.0)
Lymphocytes Relative: 29.5 % (ref 12.0–46.0)
Lymphs Abs: 2.2 10*3/uL (ref 0.7–4.0)
MCHC: 32.6 g/dL (ref 30.0–36.0)
Monocytes Relative: 6.9 % (ref 3.0–12.0)
Neutro Abs: 4.5 10*3/uL (ref 1.4–7.7)
RBC: 4.58 Mil/uL (ref 3.87–5.11)
RDW: 13.8 % (ref 11.5–14.6)

## 2012-03-14 LAB — LIPID PANEL
Cholesterol: 164 mg/dL (ref 0–200)
HDL: 46.6 mg/dL (ref >39.00)
Triglycerides: 130 mg/dL (ref 0.0–149.0)
VLDL: 26 mg/dL (ref 0.0–40.0)

## 2012-03-14 LAB — POCT URINALYSIS DIPSTICK
Blood, UA: NEGATIVE
Ketones, UA: NEGATIVE
Protein, UA: NEGATIVE
Spec Grav, UA: <=1.005
pH, UA: 6.5

## 2012-03-14 LAB — HEPATIC FUNCTION PANEL
Albumin: 3.6 g/dL (ref 3.5–5.2)
Alkaline Phosphatase: 71 U/L (ref 39–117)

## 2012-03-14 LAB — BASIC METABOLIC PANEL
CO2: 27 mEq/L (ref 19–32)
GFR: 69.9 mL/min (ref >60.00)
Glucose, Bld: 93 mg/dL (ref 70–99)
Potassium: 3.7 mEq/L (ref 3.5–5.1)
Sodium: 137 mEq/L (ref 135–145)

## 2012-03-14 MED ORDER — VALACYCLOVIR HCL 1 G PO TABS: 1000.0000 mg | ORAL_TABLET | Freq: Three times a day (TID) | ORAL | Status: DC

## 2012-03-14 NOTE — Progress Notes (Signed)
  Subjective:    Patient here for follow-up of elevated blood pressure.  She is exercising and is adherent to a low-salt diet.  Blood pressure is well controlled at home. Cardiac symptoms: none. Patient denies: chest pain, chest pressure/discomfort, claudication, dyspnea, exertional chest pressure/discomfort, fatigue, irregular heart beat, lower extremity edema, near-syncope, orthopnea, palpitations, paroxysmal nocturnal dyspnea, syncope and tachypnea. Cardiovascular risk factors: advanced age (older than 93 for men, 41 for women) and hypertension. Use of agents associated with hypertension: none. History of target organ damage: none.  The following portions of the patient's history were reviewed and updated as appropriate: allergies, current medications, past family history, past medical history, past social history, past surgical history and problem list.  Review of Systems Pertinent items are noted in HPI.     Objective:    BP 134/76  Pulse 62  Temp 97.5 F (36.4 C) (Oral)  Wt 146 lb 9.6 oz (66.497 kg)  SpO2 97% General appearance: alert, cooperative, appears stated age and no distress Lungs: clear to auscultation bilaterally Heart: S1, S2 normal Extremities: extremities normal, atraumatic, no cyanosis or edema Neurologic: Mental status: Alert, oriented, thought content appropriate    Assessment:    Hypertension, normal blood pressure . Evidence of target organ damage: none.   bipolar-- stable , con't meds Plan:    Medication: no change. Dietary sodium restriction. Regular aerobic exercise. Check blood pressures 2-3 times weekly and record. Follow up: 6 months and as needed.

## 2012-03-14 NOTE — Patient Instructions (Addendum)

## 2012-04-08 ENCOUNTER — Other Ambulatory Visit: Payer: Self-pay | Admitting: Otolaryngology

## 2012-04-08 DIAGNOSIS — H8109 Meniere's disease, unspecified ear: Secondary | ICD-10-CM

## 2012-04-08 DIAGNOSIS — H9319 Tinnitus, unspecified ear: Secondary | ICD-10-CM

## 2012-04-18 ENCOUNTER — Ambulatory Visit
Admission: RE | Admit: 2012-04-18 | Discharge: 2012-04-18 | Disposition: A | Discharge status: Home / Self Care | Payer: Medicare Other | Source: Ambulatory Visit | Attending: Otolaryngology | Admitting: Otolaryngology

## 2012-04-18 DIAGNOSIS — H905 Unspecified sensorineural hearing loss: Secondary | ICD-10-CM

## 2012-04-18 DIAGNOSIS — H8109 Meniere's disease, unspecified ear: Secondary | ICD-10-CM

## 2012-04-18 DIAGNOSIS — H9319 Tinnitus, unspecified ear: Secondary | ICD-10-CM

## 2012-04-18 MED ORDER — GADOBENATE DIMEGLUMINE 529 MG/ML IV SOLN
13.0000 mL | Freq: Once | INTRAVENOUS | Status: AC | PRN
  Administered 2012-04-18: 13 mL via INTRAVENOUS

## 2012-05-31 ENCOUNTER — Other Ambulatory Visit: Payer: Self-pay | Admitting: Otolaryngology

## 2012-05-31 DIAGNOSIS — H905 Unspecified sensorineural hearing loss: Secondary | ICD-10-CM

## 2012-05-31 DIAGNOSIS — H8109 Meniere's disease, unspecified ear: Secondary | ICD-10-CM

## 2012-05-31 DIAGNOSIS — H8110 Benign paroxysmal vertigo, unspecified ear: Secondary | ICD-10-CM

## 2012-06-03 ENCOUNTER — Other Ambulatory Visit: Payer: Self-pay | Admitting: Otolaryngology

## 2012-06-03 DIAGNOSIS — H8109 Meniere's disease, unspecified ear: Secondary | ICD-10-CM

## 2012-06-03 DIAGNOSIS — H8110 Benign paroxysmal vertigo, unspecified ear: Secondary | ICD-10-CM

## 2012-06-03 DIAGNOSIS — H905 Unspecified sensorineural hearing loss: Secondary | ICD-10-CM

## 2012-06-04 ENCOUNTER — Other Ambulatory Visit: Payer: Self-pay | Admitting: Otolaryngology

## 2012-06-07 ENCOUNTER — Ambulatory Visit
Admission: RE | Admit: 2012-06-07 | Discharge: 2012-06-07 | Disposition: A | Discharge status: Home / Self Care | Payer: Medicare Other | Source: Ambulatory Visit | Attending: Otolaryngology | Admitting: Otolaryngology

## 2012-06-07 ENCOUNTER — Other Ambulatory Visit: Payer: Medicare Other

## 2012-06-07 DIAGNOSIS — H8109 Meniere's disease, unspecified ear: Secondary | ICD-10-CM

## 2012-06-07 DIAGNOSIS — H8110 Benign paroxysmal vertigo, unspecified ear: Secondary | ICD-10-CM

## 2012-06-07 DIAGNOSIS — H905 Unspecified sensorineural hearing loss: Secondary | ICD-10-CM

## 2012-06-07 MED ORDER — IOHEXOL 300 MG/ML  SOLN
75.0000 mL | Freq: Once | INTRAMUSCULAR | Status: AC | PRN
  Administered 2012-06-07: 75 mL via INTRAVENOUS

## 2012-07-12 ENCOUNTER — Other Ambulatory Visit: Payer: Self-pay | Admitting: Family Medicine

## 2012-10-03 ENCOUNTER — Encounter: Payer: Self-pay | Admitting: Internal Medicine

## 2012-10-18 ENCOUNTER — Telehealth: Payer: Self-pay | Admitting: Internal Medicine

## 2012-10-18 DIAGNOSIS — I499 Cardiac arrhythmia, unspecified: Secondary | ICD-10-CM

## 2012-10-18 NOTE — Telephone Encounter (Signed)
New message:  Pt states heart out of rhy for the past several days.  Having chest pain off and on for several days.  Right shoulder and neck pain. Call transferred to triage.

## 2012-10-18 NOTE — Telephone Encounter (Signed)
Patient transferred from [facility] with c/o feeling like her heart is skipping beats, right shoulder pain, "some" chest pain x 3 days.  Patient c/o SOB, states she cannot climb stairs.  Patient states feels like her heart is going to jump out of her body and is fluttering, rate is fast per patient.  States chest pain is occasional and is worse when she lies down. Patient states heart feels heavy. Denies n/v; c/o dizziness, states she has ear problems so that may be coming from that.  Patient states she does break out into a sweat occasionally.  I advised patient that since Dr. Tenny Craw is in the office today that I would discuss with her and call the patient back.  Patient verbalized understanding and agreement with plan of care. I reviewed patient complaint with Dr. Tenny Craw who advised that patient make certain she is well hydrated, drinking at least 64 oz of water daily and that she is ordering a 48 hour holter monitor for patient.  Patient verbalized understanding of Dr. Charlott Rakes instructions and appointment made for monitor on Mon. 9/15.

## 2012-10-21 ENCOUNTER — Encounter: Payer: Self-pay | Admitting: *Deleted

## 2012-10-21 ENCOUNTER — Encounter (INDEPENDENT_AMBULATORY_CARE_PROVIDER_SITE_OTHER): Payer: Medicare Other

## 2012-10-21 ENCOUNTER — Other Ambulatory Visit: Payer: Self-pay | Admitting: *Deleted

## 2012-10-21 DIAGNOSIS — R002 Palpitations: Secondary | ICD-10-CM

## 2012-10-21 DIAGNOSIS — I499 Cardiac arrhythmia, unspecified: Secondary | ICD-10-CM

## 2012-10-21 NOTE — Progress Notes (Signed)
Patient ID: [name], female   DOB: 09-13-1933, 79 y.o.   MRN: [id number] E-Cardio 48 Hour Holter Monitor applied to patient.

## 2012-10-23 ENCOUNTER — Encounter: Payer: Self-pay | Admitting: Internal Medicine

## 2012-10-25 ENCOUNTER — Encounter: Payer: Self-pay | Admitting: Internal Medicine

## 2012-11-05 ENCOUNTER — Telehealth: Payer: Self-pay | Admitting: *Deleted

## 2012-11-05 NOTE — Telephone Encounter (Signed)
Left message for pt to call, monitor reviewed by dr Tenny Craw shows sinus rhythm 50 to 144 bpm. Average HR 75 bpm. Rare PVC, occ PAC short burst of PAT. 3 episodes palpitations correlated with sinus tach 120 bpm. Other problems did not correlate with arrythmia. Per dr Tenny Craw she would like to see the pt to follow up bp and labs.

## 2012-11-05 NOTE — Telephone Encounter (Signed)
Spoke with pt, aware of monitor results. Pt did not want to make a follow up with dr Tenny Craw. She wants to see dr Katrinka Blazing instead. She reports she had talked with dr Tenny Craw about this. She will call back to schedule.

## 2012-11-05 NOTE — Telephone Encounter (Signed)
Follow Up ° °Pt returning call.  °

## 2012-11-06 ENCOUNTER — Telehealth: Payer: Self-pay | Admitting: Internal Medicine

## 2012-11-06 NOTE — Telephone Encounter (Signed)
I reviewed her chart. She doesn't need to have cardiology followup with me.

## 2012-11-06 NOTE — Telephone Encounter (Signed)
Spoke with pt, aware of dr smith's recommendation. She would like me to ask him again. Apparently he was highly recommended to her from a nurse at the hosp and would really like to see him.

## 2012-11-06 NOTE — Telephone Encounter (Signed)
New Problem:  Pt states she was told she needs an appt w/ Dr. Katrinka Blazing III instead of Dr. Tenny Craw. I don't see any indication in her record regarding this. Could you clarify if the patient's plan of care needs to be transitioned to Dr. Katrinka Blazing III? Please advise

## 2012-11-06 NOTE — Telephone Encounter (Signed)
Spoke with pt, aware have sent a message to dr Katrinka Blazing for his approval to schedule.

## 2012-11-08 NOTE — Telephone Encounter (Signed)
Left message for pt to call.

## 2012-11-18 NOTE — Telephone Encounter (Signed)
Pt aware through my-chart. Dr Katrinka Blazing will not see her.

## 2012-12-10 ENCOUNTER — Encounter: Payer: Self-pay | Admitting: Interventional Cardiology

## 2012-12-11 ENCOUNTER — Encounter: Payer: Self-pay | Admitting: Interventional Cardiology

## 2012-12-11 ENCOUNTER — Ambulatory Visit (INDEPENDENT_AMBULATORY_CARE_PROVIDER_SITE_OTHER): Payer: Medicare Other | Admitting: Interventional Cardiology

## 2012-12-11 VITALS — BP 118/70 | HR 64 | Ht 66.0 in | Wt 138.0 lb

## 2012-12-11 DIAGNOSIS — E785 Hyperlipidemia, unspecified: Secondary | ICD-10-CM

## 2012-12-11 DIAGNOSIS — R002 Palpitations: Secondary | ICD-10-CM

## 2012-12-11 DIAGNOSIS — I10 Essential (primary) hypertension: Secondary | ICD-10-CM

## 2012-12-11 NOTE — Progress Notes (Signed)
Patient ID: [name], female   DOB: 1933-11-15, 79 y.o.   MRN: [id number]   Date: 12/11/2012 ID: [name], DOB 1933-11-28, MRN [id number] PCP: Loreen Freud, DO  Reason: Palpitations  ASSESSMENT;  1. Palpitations with Holter demonstrating PSVT and PACs. PSVT was short lived lasting less than 8 beats. No atrial fibrillation noted. 4 monitor performed in September. 2. Hypertension, on therapy 3. Hyperlipidemia, managed on diet and with statin 3. Anxiety 4. Mild mitral regurgitation by echocardiogram 2011 with normal LV function and mild left atrial enlargement  PLAN:  1. I reviewed the data accumulated by Dr. Tenny Craw. My impression is that the patient's cardiac concerns are more anxiety related than clinically significant. I reassured her. 2. We'll be happy to see again in the future if symptoms persist or worsen. 3. Asked patient to cut back on caffeine, and over-the-counter pseudoephedrine.   SUBJECTIVE: [name] is a 79 y.o. female who is here today to have questions answered about her prior cardiac evaluation by Dr. Tenny Craw. The patient complains of multiple vague symptoms. It appears that her major concern is that she can hear her heartbeat in her ear. Over the last 2-3 months she has noticed of the heartbeat is irregular. This frightens her and prevents falling to sleep. When she is up and moving about there are no limitations or complaints. She has also had episodes of fleeting left subclavicular and right lateral chest discomfort. These episodes last seconds before resolving. She has had a prior cardiac evaluation including 2 echocardiograms. The most recent was 2 years ago without structural abnormality. A recent continuous monitor was performed by Dr. Tenny Craw that demonstrated PACs and occasional salvos of SVT up to 150 beats per minute for 3-5 beats but not longer. No specific complaints occurred during the period of monitoring. She has never had syncope. She  denies chest pain. She has no physical limitations to   Allergies  Allergen Reactions  . Aspirin Anaphylaxis    Current Outpatient Prescriptions on File Prior to Visit  Medication Sig Dispense Refill  . atenolol (TENORMIN) 25 MG tablet One half 2 times per day  180 tablet  3  . lamoTRIgine (LAMICTAL) 200 MG tablet TAKE ONE TABLET BY MOUTH TWICE DAILY  180 tablet  1  . omeprazole (PRILOSEC) 40 MG capsule daily      . simvastatin (ZOCOR) 80 MG tablet Take 80 mg by mouth at bedtime. One half tab at bedtime      . triamterene-hydrochlorothiazide (MAXZIDE-25) 37.5-25 MG per tablet Take 1 each (1 tablet total) by mouth daily.  90 tablet  3   No current facility-administered medications on file prior to visit.    Past Medical History  Diagnosis Date  . Hypertension   . Dyslipidemia   . Palpitations   . Depression   . Hyperlipidemia   . GERD (gastroesophageal reflux disease)   . Urinary incontinence   . Hiatal hernia   . Renal cysts, acquired, bilateral   . Cholelithiasis   . Renal angiomyolipoma   . Hemorrhoids   . Tubular adenoma   . Heart murmur   . Abdominal pain     Past Surgical History  Procedure Laterality Date  . Eye surgery    . Appendectomy  10/25/10  . Cholecystectomy  10/25/10    History   Social History  . Marital Status: Married    Spouse Name: N/A    Number of Children: N/A  . Years of Education: N/A  Occupational History  . Not on file.   Social History Main Topics  . Smoking status: Never Smoker   . Smokeless tobacco: Never Used  . Alcohol Use: No  . Drug Use: No  . Sexual Activity: Not on file   Other Topics Concern  . Not on file   Social History Narrative  . No narrative on file    Family History  Problem Relation Age of Onset  . Heart disease Mother   . Depression Mother   . Cancer Mother     colon cancer  . Hypertension Father   . Heart disease Father   . Cancer Father     prostate cancer    ROS: Denies weight loss,  fever, chills, melena, hematuria, cough, wheezing, abdominal pain, transient neurological symptoms, and sustained tachycardia to Other systems negative for complaints.  OBJECTIVE: BP 118/70  Pulse 64  Ht 5\' 6"  (1.676 m)  Wt 138 lb (62.596 kg)  BMI 22.28 kg/m2,  General: No acute distress, appearing than stated age HEENT: normal without jaundice or pallor Neck: JVD flat. Carotids no bruits Chest: Clear Cardiac: Murmur: Soft 1 of 6 systolic murmur left lower sternal border. Gallop: Absent. Rhythm: Regular. Other: Normal Abdomen: Bruit: None. Pulsation: None Extremities: Edema: None. Pulses: 2+ and symmetric Neuro: Normal Psych: Very anxious  ECG: Not repeated

## 2012-12-11 NOTE — Patient Instructions (Signed)
Your physician recommends that you continue on your current medications as directed. Please refer to the Current Medication list given to you today.  Your physician recommends that you schedule a follow-up appointment in: As Needed 

## 2012-12-12 ENCOUNTER — Other Ambulatory Visit: Payer: Self-pay

## 2013-02-14 NOTE — Telephone Encounter (Signed)
No other info °

## 2013-02-19 ENCOUNTER — Other Ambulatory Visit: Payer: Self-pay

## 2013-02-19 DIAGNOSIS — Z1231 Encounter for screening mammogram for malignant neoplasm of breast: Secondary | ICD-10-CM

## 2013-03-14 ENCOUNTER — Ambulatory Visit: Payer: Medicare Other

## 2013-03-20 ENCOUNTER — Ambulatory Visit
Admission: RE | Admit: 2013-03-20 | Discharge: 2013-03-20 | Disposition: A | Discharge status: Home / Self Care | Payer: Medicare Other | Source: Ambulatory Visit

## 2013-03-20 DIAGNOSIS — Z1231 Encounter for screening mammogram for malignant neoplasm of breast: Secondary | ICD-10-CM

## 2013-04-04 ENCOUNTER — Other Ambulatory Visit: Payer: Self-pay | Admitting: Family Medicine

## 2013-07-14 ENCOUNTER — Other Ambulatory Visit: Payer: Self-pay | Admitting: Gastroenterology

## 2013-07-14 DIAGNOSIS — R109 Unspecified abdominal pain: Secondary | ICD-10-CM

## 2013-07-14 DIAGNOSIS — R1012 Left upper quadrant pain: Secondary | ICD-10-CM

## 2013-07-16 ENCOUNTER — Ambulatory Visit
Admission: RE | Admit: 2013-07-16 | Discharge: 2013-07-16 | Disposition: A | Discharge status: Home / Self Care | Payer: Medicare Other | Source: Ambulatory Visit | Attending: Gastroenterology | Admitting: Gastroenterology

## 2013-07-16 DIAGNOSIS — R109 Unspecified abdominal pain: Secondary | ICD-10-CM

## 2013-07-16 DIAGNOSIS — R1012 Left upper quadrant pain: Secondary | ICD-10-CM

## 2013-07-16 MED ORDER — IOHEXOL 300 MG/ML  SOLN
100.0000 mL | Freq: Once | INTRAMUSCULAR | Status: AC | PRN
  Administered 2013-07-16: 100 mL via INTRAVENOUS

## 2013-09-16 ENCOUNTER — Telehealth: Payer: Self-pay

## 2013-09-16 NOTE — Telephone Encounter (Signed)
Spoke with daughter and she stated he mother need to see a doctor equivalent to Dr.Hopper, I made the patient aware all of our providers are MD's and they are all equivalent, She said they just loved Dr. Linna Darner so much, I advised all of the providers are equivalent and gave her the names of the other providers in the clinic, she said never mind she would just take the appointment, Apt scheduled for 8/13 at 2 pm.      KP

## 2013-09-16 NOTE — Telephone Encounter (Signed)
Caller name: Louanna Raw Relation to [id number] Call back number: home [phone] or cell [phone] Pharmacy:  Reason for call: [facility] called today stating that they want Dr Etter Sjogren to refer [name] to Dr. Durene Cal for the pain that she is experiencing for the last 3 years. The only way she gets relief is to lay down, the pain is in the back and side. [name] has seen Dr. Collene Mares for this issue and they suggested Dr Durene Cal, but that referral has to come from PCP. I told [name] I did not know if Dr Etter Sjogren would refer without seeing patient for this issue.

## 2013-09-18 ENCOUNTER — Encounter: Payer: Self-pay | Admitting: Family Medicine

## 2013-09-18 ENCOUNTER — Ambulatory Visit (INDEPENDENT_AMBULATORY_CARE_PROVIDER_SITE_OTHER): Payer: Medicare Other | Admitting: Family Medicine

## 2013-09-18 VITALS — BP 126/64 | HR 73 | Temp 97.5°F | Wt 129.0 lb

## 2013-09-18 DIAGNOSIS — F3289 Other specified depressive episodes: Secondary | ICD-10-CM

## 2013-09-18 DIAGNOSIS — F32A Depression, unspecified: Secondary | ICD-10-CM

## 2013-09-18 DIAGNOSIS — F329 Major depressive disorder, single episode, unspecified: Secondary | ICD-10-CM

## 2013-09-18 NOTE — Progress Notes (Signed)
Pre visit review using our clinic review tool, if applicable. No additional management support is needed unless otherwise documented below in the visit note. 

## 2013-09-18 NOTE — Progress Notes (Signed)
   Subjective:    Patient ID: [name], female    DOB: 10/19/1933, 80 y.o.   MRN: [id number]  HPI  pt seeing nephrology and GI --- GI found pt new pcp closer to home--pt did not need to be here.  She will sign record release    Review of Systems     Objective:   Physical Exam        Assessment & Plan:

## 2013-10-07 ENCOUNTER — Ambulatory Visit (INDEPENDENT_AMBULATORY_CARE_PROVIDER_SITE_OTHER): Payer: Medicare Other | Admitting: Family Medicine

## 2013-10-07 ENCOUNTER — Encounter: Payer: Self-pay | Admitting: Family Medicine

## 2013-10-07 VITALS — BP 118/64 | HR 102 | Temp 97.6°F | Wt 127.2 lb

## 2013-10-07 DIAGNOSIS — Z23 Encounter for immunization: Secondary | ICD-10-CM

## 2013-10-07 DIAGNOSIS — R946 Abnormal results of thyroid function studies: Secondary | ICD-10-CM

## 2013-10-07 DIAGNOSIS — R7989 Other specified abnormal findings of blood chemistry: Secondary | ICD-10-CM

## 2013-10-07 DIAGNOSIS — M546 Pain in thoracic spine: Secondary | ICD-10-CM

## 2013-10-07 NOTE — Progress Notes (Signed)
   Subjective:    Patient ID: [name], female    DOB: Jun 20, 1933, 80 y.o.   MRN: [id number]  HPI Pt here c/o thoracic spine pain---pt has pain with walking and sitting and gets relief with laying down.  No known injury.  Pt and daughter also concerned because of a 20 lb weight loss in the last year.  Pt has seen GI    Review of Systems As above     History   Social History  . Marital Status: Married    Spouse Name: N/A    Number of Children: N/A  . Years of Education: N/A   Occupational History  . Not on file.   Social History Main Topics  . Smoking status: Never Smoker   . Smokeless tobacco: Never Used  . Alcohol Use: No  . Drug Use: No  . Sexual Activity: Not on file   Other Topics Concern  . Not on file   Social History Narrative  . No narrative on file   Past Medical History  Diagnosis Date  . Hypertension   . Dyslipidemia   . Palpitations   . Depression   . Hyperlipidemia   . GERD (gastroesophageal reflux disease)   . Urinary incontinence   . Hiatal hernia   . Renal cysts, acquired, bilateral   . Cholelithiasis   . Renal angiomyolipoma   . Hemorrhoids   . Tubular adenoma   . Heart murmur   . Abdominal pain    Current Outpatient Prescriptions  Medication Sig Dispense Refill  . co-enzyme Q-10 30 MG capsule Take 30 mg by mouth 3 (three) times daily.      Marland Kitchen omeprazole (PRILOSEC) 40 MG capsule daily       No current facility-administered medications for this visit.    Objective:   Physical Exam  BP 118/64  Pulse 102  Temp(Src) 97.6 F (36.4 C) (Oral)  Wt 127 lb 3.3 oz (57.7 kg)  SpO2 97% General appearance: alert, cooperative, appears stated age and mild distress Neurologic/ back-- pain is from mid back and radiates to front ribs                                 Good rom                               Pain with walking ,       Assessment & Plan:  1. Thoracic spine pain Pt was recently released from hospital--- GI and other  urology want pt to have an MRI per daughter and pt.    R/o compression fracture / tumor - MR Thoracic Spine Wo Contrast; Future Labs from hospital reviewed   2. Hyperthyroid---  Labs reviewed from hospital                Will repeat today

## 2013-10-07 NOTE — Patient Instructions (Signed)
Back, Compression Fracture °A compression fracture happens when a force is put upon the length of your spine. Slipping and falling on your bottom are examples of such a force. When this happens, sometimes the force is great enough to compress the building blocks (vertebral bodies) of your spine. Although this causes a lot of pain, this can usually be treated at home, unless your caregiver feels hospitalization is needed for pain control. °Your backbone (spinal column) is made up of 24 main vertebral bodies in addition to the sacrum and coccyx (see illustration). These are held together by tough fibrous tissues (ligaments) and by support of your muscles. Nerve roots pass through the openings between the vertebrae. A sudden wrenching move, injury, or a fall may cause a compression fracture of one of the vertebral bodies. This may result in back pain or spread of pain into the belly (abdomen), the buttocks, and down the leg into the foot. Pain may also be created by muscle spasm alone. °Large studies have been undertaken to determine the best possible course of action to help your back following injury and also to prevent future problems. The recommendations are as follows. °FOLLOWING A COMPRESSION FRACTURE: °Do the following only if advised by your caregiver.  °· If a back brace has been suggested or provided, wear it as directed. °· Do not stop wearing the back brace unless instructed by your caregiver. °· When allowed to return to regular activities, avoid a sedentary lifestyle. Actively exercise. Sporadic weekend binges of tennis, racquetball, or waterskiing may actually aggravate or create problems, especially if you are not in condition for that activity. °· Avoid sports requiring sudden body movements until you are in condition for them. Swimming and walking are safer activities. °· Maintain good posture. °· Avoid obesity. °· If not already done, you should have a DEXA scan. Based on the results, be treated for  osteoporosis. °FOLLOWING ACUTE (SUDDEN) INJURY: °· Only take over-the-counter or prescription medicines for pain, discomfort, or fever as directed by your caregiver. °· Use bed rest for only the most extreme acute episode. Prolonged bed rest may aggravate your condition. Ice used for acute conditions is effective. Use a large plastic bag filled with ice. Wrap it in a towel. This also provides excellent pain relief. This may be continuous. Or use it for 30 minutes every 2 hours during acute phase, then as needed. Heat for 30 minutes prior to activities is helpful. °· As soon as the acute phase (the time when your back is too painful for you to do normal activities) is over, it is important to resume normal activities and work hardening programs. Back injuries can cause potentially marked changes in lifestyle. So it is important to attack these problems aggressively. °· See your caregiver for continued problems. He or she can help or refer you for appropriate exercises, physical therapy, and work hardening if needed. °· If you are given narcotic medications for your condition, for the next 24 hours do not: °¨ Drive. °¨ Operate machinery or power tools. °¨ Sign legal documents. °· Do not drink alcohol, or take sleeping pills or other medications that may interfere with treatment. °If your caregiver has given you a follow-up appointment, it is very important to keep that appointment. Not keeping the appointment could result in a chronic or permanent injury, pain, and disability. If there is any problem keeping the appointment, you must call back to this facility for assistance.  °SEEK IMMEDIATE MEDICAL CARE IF: °· You develop numbness,   tingling, weakness, or problems with the use of your arms or legs. °· You develop severe back pain not relieved with medications. °· You have changes in bowel or bladder control. °· You have increasing pain in any areas of the body. °Document Released: 01/23/2005 Document Revised:  06/09/2013 Document Reviewed: 08/28/2007 °ExitCare® Patient Information ©2015 [employer]. This information is not intended to replace advice given to you by your health care provider. Make sure you discuss any questions you have with your health care provider. ° °

## 2013-10-07 NOTE — Progress Notes (Signed)
Pre visit review using our clinic review tool, if applicable. No additional management support is needed unless otherwise documented below in the visit note. 

## 2013-10-08 ENCOUNTER — Other Ambulatory Visit: Payer: Self-pay | Admitting: Family Medicine

## 2013-10-08 DIAGNOSIS — M546 Pain in thoracic spine: Secondary | ICD-10-CM

## 2013-10-08 DIAGNOSIS — M545 Low back pain, unspecified: Secondary | ICD-10-CM

## 2013-10-08 LAB — CBC WITH DIFFERENTIAL/PLATELET
BASOS ABS: 0 10*3/uL (ref 0.0–0.1)
Basophils Relative: 0.3 % (ref 0.0–3.0)
EOS ABS: 0.1 10*3/uL (ref 0.0–0.7)
Eosinophils Relative: 1.9 % (ref 0.0–5.0)
HEMATOCRIT: 45.4 % (ref 36.0–46.0)
HEMOGLOBIN: 14.9 g/dL (ref 12.0–15.0)
LYMPHS ABS: 2.3 10*3/uL (ref 0.7–4.0)
Lymphocytes Relative: 31.7 % (ref 12.0–46.0)
MCHC: 32.9 g/dL (ref 30.0–36.0)
MCV: 90.1 fl (ref 78.0–100.0)
Monocytes Absolute: 0.5 10*3/uL (ref 0.1–1.0)
Monocytes Relative: 6.9 % (ref 3.0–12.0)
NEUTROS ABS: 4.2 10*3/uL (ref 1.4–7.7)
Neutrophils Relative %: 59.2 % (ref 43.0–77.0)
Platelets: 344 10*3/uL (ref 150.0–400.0)
RBC: 5.04 Mil/uL (ref 3.87–5.11)
RDW: 14.3 % (ref 11.5–15.5)
WBC: 7.1 10*3/uL (ref 4.0–10.5)

## 2013-10-08 LAB — T3, FREE: T3, Free: 3.1 pg/mL (ref 2.3–4.2)

## 2013-10-08 LAB — TSH: TSH: 1.23 u[IU]/mL (ref 0.35–4.50)

## 2013-10-08 LAB — T4, FREE: Free T4: 1.07 ng/dL (ref 0.60–1.60)

## 2013-10-09 ENCOUNTER — Telehealth: Payer: Self-pay

## 2013-10-09 NOTE — Telephone Encounter (Signed)
Left message on voice mail for patient to return the call.   Dr Etter Sjogren would like to do an x-ray before the MRI.

## 2013-10-16 ENCOUNTER — Telehealth: Payer: Self-pay | Admitting: Internal Medicine

## 2013-10-16 ENCOUNTER — Ambulatory Visit (HOSPITAL_BASED_OUTPATIENT_CLINIC_OR_DEPARTMENT_OTHER)
Admission: RE | Admit: 2013-10-16 | Discharge: 2013-10-16 | Disposition: A | Discharge status: Home / Self Care | Payer: Medicare Other | Source: Ambulatory Visit | Attending: Family Medicine | Admitting: Family Medicine

## 2013-10-16 DIAGNOSIS — M47814 Spondylosis without myelopathy or radiculopathy, thoracic region: Secondary | ICD-10-CM | POA: Insufficient documentation

## 2013-10-16 DIAGNOSIS — M47817 Spondylosis without myelopathy or radiculopathy, lumbosacral region: Secondary | ICD-10-CM | POA: Diagnosis not present

## 2013-10-16 DIAGNOSIS — Z9181 History of falling: Secondary | ICD-10-CM | POA: Insufficient documentation

## 2013-10-16 DIAGNOSIS — M546 Pain in thoracic spine: Secondary | ICD-10-CM

## 2013-10-16 DIAGNOSIS — M545 Low back pain, unspecified: Secondary | ICD-10-CM

## 2013-10-16 DIAGNOSIS — M549 Dorsalgia, unspecified: Secondary | ICD-10-CM | POA: Diagnosis present

## 2013-10-16 NOTE — Telephone Encounter (Signed)
Pt's daughter is requesting results from pt's x-rays done today and her lab results.

## 2013-10-17 NOTE — Telephone Encounter (Signed)
No compression fracture--- pt insurance would let us do MRI yet. We can refer her to ortho --- they can evaluate pain further and see if mri can be ordered then --if you agree. No compression fracture    Discussed with patient and daughter Lattie Haw on  Conference call they voice understanding. Ref placed.     KP

## 2013-10-21 NOTE — Telephone Encounter (Signed)
See 9/10 encounter

## 2013-12-15 ENCOUNTER — Other Ambulatory Visit: Payer: Self-pay | Admitting: Obstetrics and Gynecology

## 2013-12-16 LAB — CYTOLOGY - PAP

## 2013-12-19 ENCOUNTER — Other Ambulatory Visit: Payer: Self-pay | Admitting: Sports Medicine

## 2013-12-19 DIAGNOSIS — R0602 Shortness of breath: Secondary | ICD-10-CM

## 2013-12-19 DIAGNOSIS — R071 Chest pain on breathing: Secondary | ICD-10-CM

## 2013-12-22 ENCOUNTER — Ambulatory Visit
Admission: RE | Admit: 2013-12-22 | Discharge: 2013-12-22 | Disposition: A | Discharge status: Home / Self Care | Payer: Medicare Other | Source: Ambulatory Visit | Attending: Sports Medicine | Admitting: Sports Medicine

## 2013-12-22 DIAGNOSIS — R0602 Shortness of breath: Secondary | ICD-10-CM

## 2013-12-22 DIAGNOSIS — R071 Chest pain on breathing: Secondary | ICD-10-CM

## 2014-05-11 ENCOUNTER — Telehealth: Payer: Self-pay | Admitting: Family Medicine

## 2014-05-11 NOTE — Telephone Encounter (Signed)
Spoke with patient and she stated she has not taken the medication since 2013. I advised that she needs an apt. Apt scheduled for 05/12/14 at 2:30.     KP

## 2014-05-11 NOTE — Telephone Encounter (Signed)
Caller name: Tristy Relation to pt: self Call back number: [phone] Pharmacy: [facility] in [place]  Reason for call:   Patient states that she had stopped taking her "nerve" medication(does not know name) and is requesting to go back on.

## 2014-05-12 ENCOUNTER — Ambulatory Visit: Payer: Self-pay | Admitting: Family Medicine

## 2014-06-14 ENCOUNTER — Encounter (HOSPITAL_COMMUNITY): Payer: Self-pay | Admitting: *Deleted

## 2014-06-14 ENCOUNTER — Emergency Department (HOSPITAL_COMMUNITY): Payer: Medicare Other

## 2014-06-14 ENCOUNTER — Inpatient Hospital Stay (HOSPITAL_COMMUNITY)
Admission: EM | Admit: 2014-06-14 | Discharge: 2014-06-19 | DRG: 177 | Disposition: A | Discharge status: Home Health | Payer: Medicare Other | Attending: Internal Medicine | Admitting: Internal Medicine

## 2014-06-14 DIAGNOSIS — J690 Pneumonitis due to inhalation of food and vomit: Secondary | ICD-10-CM | POA: Diagnosis present

## 2014-06-14 DIAGNOSIS — I4891 Unspecified atrial fibrillation: Secondary | ICD-10-CM | POA: Diagnosis present

## 2014-06-14 DIAGNOSIS — Z886 Allergy status to analgesic agent status: Secondary | ICD-10-CM | POA: Diagnosis not present

## 2014-06-14 DIAGNOSIS — I10 Essential (primary) hypertension: Secondary | ICD-10-CM | POA: Diagnosis present

## 2014-06-14 DIAGNOSIS — F419 Anxiety disorder, unspecified: Secondary | ICD-10-CM | POA: Diagnosis present

## 2014-06-14 DIAGNOSIS — J189 Pneumonia, unspecified organism: Secondary | ICD-10-CM | POA: Diagnosis present

## 2014-06-14 DIAGNOSIS — F0390 Unspecified dementia without behavioral disturbance: Secondary | ICD-10-CM | POA: Diagnosis not present

## 2014-06-14 DIAGNOSIS — F0391 Unspecified dementia with behavioral disturbance: Secondary | ICD-10-CM | POA: Diagnosis not present

## 2014-06-14 DIAGNOSIS — I4510 Unspecified right bundle-branch block: Secondary | ICD-10-CM | POA: Diagnosis present

## 2014-06-14 DIAGNOSIS — T17908A Unspecified foreign body in respiratory tract, part unspecified causing other injury, initial encounter: Secondary | ICD-10-CM | POA: Insufficient documentation

## 2014-06-14 DIAGNOSIS — G3109 Other frontotemporal dementia: Secondary | ICD-10-CM | POA: Diagnosis present

## 2014-06-14 DIAGNOSIS — K5900 Constipation, unspecified: Secondary | ICD-10-CM | POA: Diagnosis present

## 2014-06-14 DIAGNOSIS — R531 Weakness: Secondary | ICD-10-CM

## 2014-06-14 DIAGNOSIS — E43 Unspecified severe protein-calorie malnutrition: Secondary | ICD-10-CM | POA: Diagnosis present

## 2014-06-14 DIAGNOSIS — G1221 Amyotrophic lateral sclerosis: Secondary | ICD-10-CM | POA: Diagnosis present

## 2014-06-14 DIAGNOSIS — I480 Paroxysmal atrial fibrillation: Secondary | ICD-10-CM | POA: Diagnosis present

## 2014-06-14 DIAGNOSIS — K219 Gastro-esophageal reflux disease without esophagitis: Secondary | ICD-10-CM | POA: Diagnosis present

## 2014-06-14 DIAGNOSIS — T17908S Unspecified foreign body in respiratory tract, part unspecified causing other injury, sequela: Secondary | ICD-10-CM | POA: Diagnosis not present

## 2014-06-14 DIAGNOSIS — Z9049 Acquired absence of other specified parts of digestive tract: Secondary | ICD-10-CM | POA: Diagnosis present

## 2014-06-14 DIAGNOSIS — I4581 Long QT syndrome: Secondary | ICD-10-CM

## 2014-06-14 DIAGNOSIS — R1310 Dysphagia, unspecified: Secondary | ICD-10-CM | POA: Diagnosis present

## 2014-06-14 DIAGNOSIS — I481 Persistent atrial fibrillation: Secondary | ICD-10-CM | POA: Diagnosis present

## 2014-06-14 DIAGNOSIS — I2510 Atherosclerotic heart disease of native coronary artery without angina pectoris: Secondary | ICD-10-CM | POA: Diagnosis present

## 2014-06-14 DIAGNOSIS — F0280 Dementia in other diseases classified elsewhere without behavioral disturbance: Secondary | ICD-10-CM | POA: Diagnosis present

## 2014-06-14 DIAGNOSIS — F329 Major depressive disorder, single episode, unspecified: Secondary | ICD-10-CM | POA: Diagnosis present

## 2014-06-14 DIAGNOSIS — R05 Cough: Secondary | ICD-10-CM | POA: Diagnosis present

## 2014-06-14 DIAGNOSIS — E785 Hyperlipidemia, unspecified: Secondary | ICD-10-CM | POA: Diagnosis present

## 2014-06-14 DIAGNOSIS — T17908D Unspecified foreign body in respiratory tract, part unspecified causing other injury, subsequent encounter: Secondary | ICD-10-CM | POA: Diagnosis not present

## 2014-06-14 HISTORY — DX: Paroxysmal atrial fibrillation: I48.0

## 2014-06-14 HISTORY — DX: Atherosclerotic heart disease of native coronary artery without angina pectoris: I25.10

## 2014-06-14 LAB — URINALYSIS, ROUTINE W REFLEX MICROSCOPIC
BILIRUBIN URINE: NEGATIVE
Glucose, UA: NEGATIVE mg/dL
Hgb urine dipstick: NEGATIVE
KETONES UR: NEGATIVE mg/dL
LEUKOCYTES UA: NEGATIVE
NITRITE: NEGATIVE
PROTEIN: NEGATIVE mg/dL
Specific Gravity, Urine: 1.013 (ref 1.005–1.030)
Urobilinogen, UA: 0.2 mg/dL (ref 0.0–1.0)
pH: 7 (ref 5.0–8.0)

## 2014-06-14 LAB — BRAIN NATRIURETIC PEPTIDE: B Natriuretic Peptide: 313.4 pg/mL — ABNORMAL HIGH (ref 0.0–100.0)

## 2014-06-14 LAB — CBC WITH DIFFERENTIAL/PLATELET
BASOS ABS: 0 10*3/uL (ref 0.0–0.1)
BASOS PCT: 0 % (ref 0–1)
Eosinophils Absolute: 0 10*3/uL (ref 0.0–0.7)
Eosinophils Relative: 0 % (ref 0–5)
HCT: 45.2 % (ref 36.0–46.0)
Hemoglobin: 15 g/dL (ref 12.0–15.0)
Lymphocytes Relative: 9 % — ABNORMAL LOW (ref 12–46)
Lymphs Abs: 1 10*3/uL (ref 0.7–4.0)
MCH: 29.6 pg (ref 26.0–34.0)
MCHC: 33.2 g/dL (ref 30.0–36.0)
MCV: 89.3 fL (ref 78.0–100.0)
Monocytes Absolute: 0.7 10*3/uL (ref 0.1–1.0)
Monocytes Relative: 6 % (ref 3–12)
NEUTROS PCT: 85 % — AB (ref 43–77)
Neutro Abs: 9.7 10*3/uL — ABNORMAL HIGH (ref 1.7–7.7)
Platelets: 411 10*3/uL — ABNORMAL HIGH (ref 150–400)
RBC: 5.06 MIL/uL (ref 3.87–5.11)
RDW: 13.8 % (ref 11.5–15.5)
WBC: 11.4 10*3/uL — ABNORMAL HIGH (ref 4.0–10.5)

## 2014-06-14 LAB — BASIC METABOLIC PANEL
Anion gap: 9 (ref 5–15)
BUN: 16 mg/dL (ref 6–20)
CO2: 26 mmol/L (ref 22–32)
Calcium: 9 mg/dL (ref 8.9–10.3)
Chloride: 101 mmol/L (ref 101–111)
Creatinine, Ser: 0.58 mg/dL (ref 0.44–1.00)
GFR calc non Af Amer: >60 mL/min (ref >60)
Glucose, Bld: 146 mg/dL — ABNORMAL HIGH (ref 70–99)
POTASSIUM: 3.9 mmol/L (ref 3.5–5.1)
SODIUM: 136 mmol/L (ref 135–145)

## 2014-06-14 LAB — STREP PNEUMONIAE URINARY ANTIGEN: STREP PNEUMO URINARY ANTIGEN: NEGATIVE

## 2014-06-14 LAB — TROPONIN I

## 2014-06-14 LAB — MRSA PCR SCREENING: MRSA by PCR: NEGATIVE

## 2014-06-14 MED ORDER — DEXTROSE 5 % IV SOLN
1.0000 g | Freq: Once | INTRAVENOUS | Status: AC
  Administered 2014-06-14: 1 g via INTRAVENOUS
  Filled 2014-06-14: qty 1

## 2014-06-14 MED ORDER — ALPRAZOLAM 0.5 MG PO TABS
0.5000 mg | ORAL_TABLET | Freq: Two times a day (BID) | ORAL | Status: DC | PRN
  Administered 2014-06-15 – 2014-06-17 (×3): 0.5 mg via ORAL
  Filled 2014-06-14 (×3): qty 1

## 2014-06-14 MED ORDER — VANCOMYCIN HCL 500 MG IV SOLR
500.0000 mg | Freq: Two times a day (BID) | INTRAVENOUS | Status: DC
  Administered 2014-06-15 – 2014-06-17 (×5): 500 mg via INTRAVENOUS
  Filled 2014-06-14 (×8): qty 500

## 2014-06-14 MED ORDER — VANCOMYCIN HCL IN DEXTROSE 1-5 GM/200ML-% IV SOLN
1000.0000 mg | INTRAVENOUS | Status: AC
  Administered 2014-06-15: 1000 mg via INTRAVENOUS
  Filled 2014-06-14: qty 200

## 2014-06-14 MED ORDER — VITAMIN D3 25 MCG (1000 UNIT) PO TABS: 2000.0000 [IU] | ORAL_TABLET | Freq: Every day | ORAL | Status: DC

## 2014-06-14 MED ORDER — VITAMIN D 1000 UNITS PO TABS
2000.0000 [IU] | ORAL_TABLET | Freq: Every day | ORAL | Status: DC
  Administered 2014-06-15 – 2014-06-19 (×6): 2000 [IU] via ORAL
  Filled 2014-06-14 (×7): qty 2

## 2014-06-14 MED ORDER — POLYETHYLENE GLYCOL 3350 17 GM/SCOOP PO POWD
17.0000 g | Freq: Every day | ORAL | Status: DC | PRN
  Filled 2014-06-14: qty 255

## 2014-06-14 MED ORDER — DILTIAZEM LOAD VIA INFUSION
10.0000 mg | Freq: Once | INTRAVENOUS | Status: AC
  Administered 2014-06-14: 10 mg via INTRAVENOUS
  Filled 2014-06-14: qty 10

## 2014-06-14 MED ORDER — PANTOPRAZOLE SODIUM 40 MG PO TBEC
40.0000 mg | DELAYED_RELEASE_TABLET | Freq: Every day | ORAL | Status: DC
  Filled 2014-06-14: qty 1

## 2014-06-14 MED ORDER — APIXABAN 5 MG PO TABS
5.0000 mg | ORAL_TABLET | Freq: Two times a day (BID) | ORAL | Status: DC
  Administered 2014-06-15 – 2014-06-19 (×10): 5 mg via ORAL
  Filled 2014-06-14 (×11): qty 1

## 2014-06-14 MED ORDER — TRAZODONE HCL 50 MG PO TABS
25.0000 mg | ORAL_TABLET | Freq: Every day | ORAL | Status: DC
  Administered 2014-06-15 – 2014-06-16 (×3): 25 mg via ORAL
  Filled 2014-06-14 (×4): qty 1

## 2014-06-14 MED ORDER — SODIUM CHLORIDE 0.9 % IV BOLUS (SEPSIS)
1000.0000 mL | Freq: Once | INTRAVENOUS | Status: AC
  Administered 2014-06-14: 1000 mL via INTRAVENOUS

## 2014-06-14 MED ORDER — FLECAINIDE ACETATE 50 MG PO TABS
150.0000 mg | ORAL_TABLET | Freq: Two times a day (BID) | ORAL | Status: DC
  Administered 2014-06-15 (×2): 150 mg via ORAL
  Filled 2014-06-14 (×3): qty 1

## 2014-06-14 MED ORDER — GABAPENTIN 300 MG PO CAPS
300.0000 mg | ORAL_CAPSULE | Freq: Three times a day (TID) | ORAL | Status: DC
  Administered 2014-06-15 – 2014-06-19 (×14): 300 mg via ORAL
  Filled 2014-06-14 (×16): qty 1

## 2014-06-14 MED ORDER — DEXTROSE 5 % IV SOLN
1.0000 g | INTRAVENOUS | Status: DC
  Administered 2014-06-15 – 2014-06-16 (×2): 1 g via INTRAVENOUS
  Filled 2014-06-14 (×4): qty 1

## 2014-06-14 MED ORDER — DEXTROSE 5 % IV SOLN
5.0000 mg/h | INTRAVENOUS | Status: DC
  Administered 2014-06-14: 5 mg/h via INTRAVENOUS

## 2014-06-14 MED ORDER — METOCLOPRAMIDE HCL 5 MG PO TABS
5.0000 mg | ORAL_TABLET | Freq: Three times a day (TID) | ORAL | Status: DC
  Administered 2014-06-14 – 2014-06-19 (×18): 5 mg via ORAL
  Filled 2014-06-14 (×21): qty 1

## 2014-06-14 MED ORDER — ONDANSETRON 4 MG PO TBDP
4.0000 mg | ORAL_TABLET | Freq: Four times a day (QID) | ORAL | Status: DC | PRN
  Administered 2014-06-14: 4 mg via ORAL
  Filled 2014-06-14 (×2): qty 1

## 2014-06-14 MED ORDER — POLYETHYLENE GLYCOL 3350 17 G PO PACK
17.0000 g | PACK | Freq: Every day | ORAL | Status: DC | PRN
  Administered 2014-06-18: 17 g via ORAL
  Filled 2014-06-14 (×2): qty 1

## 2014-06-14 MED ORDER — VITAMIN D3 50 MCG (2000 UT) PO CAPS: 2000.0000 [IU] | ORAL_CAPSULE | Freq: Every day | ORAL | Status: DC

## 2014-06-14 MED ORDER — DILTIAZEM HCL ER COATED BEADS 120 MG PO CP24
120.0000 mg | ORAL_CAPSULE | Freq: Every day | ORAL | Status: DC
  Administered 2014-06-15: 120 mg via ORAL
  Filled 2014-06-14 (×2): qty 1

## 2014-06-14 MED ORDER — ADULT MULTIVITAMIN W/MINERALS CH
1.0000 | ORAL_TABLET | Freq: Every day | ORAL | Status: DC
  Administered 2014-06-15 – 2014-06-19 (×5): 1 via ORAL
  Filled 2014-06-14 (×5): qty 1

## 2014-06-14 MED ORDER — PANTOPRAZOLE SODIUM 40 MG PO TBEC
40.0000 mg | DELAYED_RELEASE_TABLET | Freq: Once | ORAL | Status: AC
  Administered 2014-06-14: 40 mg via ORAL

## 2014-06-14 NOTE — ED Notes (Signed)
Bolus complete of Cardizem

## 2014-06-14 NOTE — ED Notes (Signed)
Rapid heart rate for one hour .  She just had her heart shocked back into rhy last week.  No chest pain

## 2014-06-14 NOTE — Progress Notes (Signed)
ANTIBIOTIC CONSULT NOTE - INITIAL  Pharmacy Consult for Vancomycin and Cefepime Indication: rule out pneumonia  Allergies  Allergen Reactions  . Aspirin Anaphylaxis    Patient Measurements: Height: 5\' 4"  (162.6 cm) Weight: 118 lb (53.524 kg) IBW/kg (Calculated) : 54.7  Vital Signs: Temp: 98.1 F (36.7 C) (05/08 1637) BP: 148/79 mmHg (05/08 1745) Pulse Rate: 99 (05/08 1745) Intake/Output from previous day:   Intake/Output from this shift:    Labs:  Recent Labs  06/14/14 1637  WBC 11.4*  HGB 15.0  PLT 411*  CREATININE 0.58   Estimated Creatinine Clearance: 48.2 mL/min (by C-G formula based on Cr of 0.58). No results for input(s): VANCOTROUGH, VANCOPEAK, VANCORANDOM, GENTTROUGH, GENTPEAK, GENTRANDOM, TOBRATROUGH, TOBRAPEAK, TOBRARND, AMIKACINPEAK, AMIKACINTROU, AMIKACIN in the last 72 hours.   Microbiology: No results found for this or any previous visit (from the past 720 hour(s)).  Medical History: Past Medical History  Diagnosis Date  . Hypertension   . Dyslipidemia   . Palpitations   . Depression   . Hyperlipidemia   . GERD (gastroesophageal reflux disease)   . Urinary incontinence   . Hiatal hernia   . Renal cysts, acquired, bilateral   . Cholelithiasis   . Renal angiomyolipoma   . Hemorrhoids   . Tubular adenoma   . Heart murmur   . Abdominal pain   . Coronary artery disease   . Atrial fibrillation     Medications:  See electronic med rec  Assessment: 81 y.o. female presents with tachycardia - afib with RVR. Noted h/o afib. CXR shows possible PNA - to start broad spectrum antibiotics (Vanc and Cefepime) for r/o PNA. WBC slightly elevated to 11.4. Estimated CrCl 48 ml/min.  Goal of Therapy:  Vancomycin trough level 15-20 mcg/ml  Plan:  Cefepime 1gm IV q24h Vancomycin 1gm IV now then 500mg  IV q12h Will f/u renal function, pt's clinical condition, and micro data Vanc trough prn  Sherlon Handing, PharmD, BCPS Clinical pharmacist, pager  [phone] 06/14/2014,6:12 PM

## 2014-06-14 NOTE — Consult Note (Signed)
Referring Physician: Primary Physician: Dorris Carnes, MD Primary Cardiologist: Dr. Ola Spurr in [facility] (however patient and her family expressed wishes to transfer their cardiology care to one of the [facility] cardiologists) Reason for Consultation:  A. fib with RVR   HPI:  This is 81 year old Caucasian female with past medical history of hypertension, hyperlipidemia as well as atrial fibrillation.  The patient was recently admitted to [facility] for about 3 days and underwent DC CV on Tuesday, 06/09/2014. Currently she is on flex denied 150 mg twice a day. Patient presented to the emergency department with complaints of cough, shortness of breath, palpitations and heart tracings and was found to have atrial fibrillation with a heart rate of 130.  He was started on diltiazem drip per emergency department provider with her heart rate going down to 100-110 bpm. Patient was noted to have left lower lobe patchy opacities on her chest x-ray as well as leukocytosis with left shift on her blood work. On further questioning it turns out that patient is currently undergoing evaluation for both frontal dementia and amyotrophic lateral sclerosis and has an appointment with neurology on May 19th to formally confirm it. What the family reported to me that she lost 40 pounds over the last year. She occasionally chokes on food and cannot chew very well. She also was noted to have weak cough and not always able to take deep enough breath. During my evaluation patient has notably weak voice.   Time of my evaluation patient denied chest pain, palpitations. She reported diffuse abdominal pain, mild nausea.  Patient denied active symptoms of chest pain, syncope or presyncope, PND or orthopnea, diarrhea, dysuria   Review of Systems:  12 systems were reviewed nad were negative except mentioned in the HPI    Past Medical History  Diagnosis Date  . Hypertension   . Dyslipidemia   .  Palpitations   . Depression   . Hyperlipidemia   . GERD (gastroesophageal reflux disease)   . Urinary incontinence   . Hiatal hernia   . Renal cysts, acquired, bilateral   . Cholelithiasis   . Renal angiomyolipoma   . Hemorrhoids   . Tubular adenoma   . Heart murmur   . Abdominal pain   . Coronary artery disease   . Atrial fibrillation    Past Surgical History  Procedure Laterality Date  . Eye surgery    . Appendectomy  10/25/10  . Cholecystectomy  10/25/10     Current Medications: . apixaban  5 mg Oral BID  . diltiazem  120 mg Oral QHS  . flecainide  150 mg Oral BID  . gabapentin  300 mg Oral TID  . metoCLOPramide  5 mg Oral TID AC & HS  . multivitamin with minerals  1 tablet Oral Daily  . traZODone  25 mg Oral QHS  . Vitamin D3  2,000 Units Oral Daily   Infusions:  . ceFEPime (MAXIPIME) IV 1 g (06/14/14 1941)  . [START ON 06/15/2014] ceFEPime (MAXIPIME) IV    . diltiazem (CARDIZEM) infusion 7.5 mg/hr (06/14/14 1832)  . sodium chloride 1,000 mL (06/14/14 1936)  . [START ON 06/15/2014] vancomycin    . vancomycin       (Not in a hospital admission)   Allergies  Allergen Reactions  . Aspirin Anaphylaxis    History   Social History  . Marital Status: Married    Spouse Name: N/A  . Number of Children: N/A  . Years of Education:  N/A   Occupational History  . Not on file.   Social History Main Topics  . Smoking status: Never Smoker   . Smokeless tobacco: Never Used  . Alcohol Use: No  . Drug Use: No  . Sexual Activity: Not on file   Other Topics Concern  . Not on file   Social History Narrative    Family History  Problem Relation Age of Onset  . Heart disease Mother   . Depression Mother   . Cancer Mother     colon cancer  . Hypertension Father   . Heart disease Father   . Cancer Father     prostate cancer   Family Status  Relation Status Death Age  . Mother Deceased   . Father Deceased   . Brother Alive   . Brother Alive   . Brother  Alive     PHYSICAL EXAM: Filed Vitals:   06/14/14 1915  BP: 165/85  Pulse: 72  Temp:   Resp: 26    No intake or output data in the 24 hours ending 06/14/14 1948  General:   ill appearing, frail female,  temporal wasting, muscle atrophy of bilat hands HEENT:  weak voice. Neck: supple. no JVD. Carotids 2+ bilat; no bruits. No lymphadenopathy or thryomegaly appreciated. Cor: PMI nondisplaced. Regular rate & rhythm. No rubs, gallops or murmurs. Lungs: coarse breath sounds bilaterally, weak voice, weak cough  Abdomen: soft, tender, nondistended. Extremities: no cyanosis, clubbing, rash, edema, 4+/5 strength in bilateral upper and lower extremities Neuro: alert & oriented x 2, was oriented to year but was not handed to to date,  weak voice, Affect pleasant.   point-of-care ultrasound: Cardiac vascular: Grossly normal RV and LV function, no pericardial effusion, IVC is flat and collapses on respiration Lungs: Bilateral anterior A-line pattern ruling out significant pulmonary edema, bibasilar B-lines ( earlyinfiltrates versus atelectasis )  Results for orders placed or performed during the hospital encounter of 06/14/14 (from the past 24 hour(s))  Basic metabolic panel     Status: Abnormal   Collection Time: 06/14/14  4:37 PM  Result Value Ref Range   Sodium 136 135 - 145 mmol/L   Potassium 3.9 3.5 - 5.1 mmol/L   Chloride 101 101 - 111 mmol/L   CO2 26 22 - 32 mmol/L   Glucose, Bld 146 (H) 70 - 99 mg/dL   BUN 16 6 - 20 mg/dL   Creatinine, Ser 0.58 0.44 - 1.00 mg/dL   Calcium 9.0 8.9 - 10.3 mg/dL   GFR calc non Af Amer >60 >60 mL/min   GFR calc Af Amer >60 >60 mL/min   Anion gap 9 5 - 15  BNP (order ONLY if patient complains of dyspnea/SOB AND you have documented it for THIS visit)     Status: Abnormal   Collection Time: 06/14/14  4:37 PM  Result Value Ref Range   B Natriuretic Peptide 313.4 (H) 0.0 - 100.0 pg/mL  Troponin I     Status: None   Collection Time: 06/14/14  4:37 PM    Result Value Ref Range   Troponin I <0.03 <0.031 ng/mL  CBC with Differential     Status: Abnormal   Collection Time: 06/14/14  4:37 PM  Result Value Ref Range   WBC 11.4 (H) 4.0 - 10.5 K/uL   RBC 5.06 3.87 - 5.11 MIL/uL   Hemoglobin 15.0 12.0 - 15.0 g/dL   HCT 45.2 36.0 - 46.0 %   MCV 89.3 78.0 - 100.0 fL  MCH 29.6 26.0 - 34.0 pg   MCHC 33.2 30.0 - 36.0 g/dL   RDW 13.8 11.5 - 15.5 %   Platelets 411 (H) 150 - 400 K/uL   Neutrophils Relative % 85 (H) 43 - 77 %   Neutro Abs 9.7 (H) 1.7 - 7.7 K/uL   Lymphocytes Relative 9 (L) 12 - 46 %   Lymphs Abs 1.0 0.7 - 4.0 K/uL   Monocytes Relative 6 3 - 12 %   Monocytes Absolute 0.7 0.1 - 1.0 K/uL   Eosinophils Relative 0 0 - 5 %   Eosinophils Absolute 0.0 0.0 - 0.7 K/uL   Basophils Relative 0 0 - 1 %   Basophils Absolute 0.0 0.0 - 0.1 K/uL  Urinalysis, Routine w reflex microscopic     Status: None   Collection Time: 06/14/14  6:25 PM  Result Value Ref Range   Color, Urine YELLOW YELLOW   APPearance CLEAR CLEAR   Specific Gravity, Urine 1.013 1.005 - 1.030   pH 7.0 5.0 - 8.0   Glucose, UA NEGATIVE NEGATIVE mg/dL   Hgb urine dipstick NEGATIVE NEGATIVE   Bilirubin Urine NEGATIVE NEGATIVE   Ketones, ur NEGATIVE NEGATIVE mg/dL   Protein, ur NEGATIVE NEGATIVE mg/dL   Urobilinogen, UA 0.2 0.0 - 1.0 mg/dL   Nitrite NEGATIVE NEGATIVE   Leukocytes, UA NEGATIVE NEGATIVE   Radiology:  Dg Chest Port 1 View  06/14/2014   CLINICAL DATA:  Tachycardia. Cough, 1 week duration. Recent cardiac dysrhythmia. Atrial fibrillation.  EXAM: PORTABLE CHEST - 1 VIEW  COMPARISON:  06/01/2014.  09/30/2013  FINDINGS: Heart size is normal. Mediastinal shadows are normal. There is patchy density in the left lower lobe the could be atelectasis or pneumonia. The right lung is clear. No evidence of edema or effusions.  IMPRESSION: Patchy density in the left lower lobe that could be atelectasis or pneumonia.   Electronically Signed   By: Nelson Chimes M.D.   On: 06/14/2014  17:51    ECG (personally reviewed and interpreted): Atrial fibrillation rate 131 bpm, right bundle branch block, prolonged QTc  ECHO 03/30/2009: - Left ventricle: The cavity size was normal. There was mild focal  basal hypertrophy of the septum. Systolic function was normal. The  estimated ejection fraction was in the range of 55% to 60%. There  was dynamic obstruction during Valsalva in the mid cavity, with a  peak velocity of 168cm/sec and a peak gradient of 84mm Hg. Wall  motion was normal; there were no regional wall motion  abnormalities. - Aortic valve: Valve area: 3.2cm^2(VTI). Valve area: 3.18cm^2  (Vmax). - Mitral valve: Mild regurgitation. - Left atrium: The atrium was mildly dilated.  ASSESSMENT:  A. fib RVR Prolonged QTC Healthcare associated / aspiration pneumonia ALS/dementia   DISCUSSION: Patient has known persistent atrial fibrillation and recent underwent cardioversion. Currently it appears that she likely has healthcare associated pneumonia. She is also at high risk for aspiration given difficulty swallowing and reported choking on the food. She does have symptomatic atrial fibrillation which will need to be addressed however I feel that Pneumonia, some dehydration and maybe electrolyte disturbances are the driving force for current episode. 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  Recommendations: - Continue diltiazem consider switching to equivalent dose of short-acting diltiazem by mouth if one to avoid diltiazem drip  - continue Eliquis 5 mg twice a day for stroke prevention  -  continue flecainide 150 g twice a day - keep magnesium above 2 and potassium above 4 - we'll consider cardioversion later after patient is rehydrated, pneumonia is treated, electrolites are supplemented  -  please check TSH and free T4 - I have asked  emergency department to get blood, sputum cultures, strep pneumonia and urine Legionella antigen. There are starting patient on vancomycin and Zosyn.  -  would start goals of care discussion with patient and patient's family    will follow  Inez Pilgrim, MD 06/14/2014 7:48 PM

## 2014-06-14 NOTE — ED Notes (Signed)
Dr. Gardner at bedside 

## 2014-06-14 NOTE — H&P (Signed)
[facility] History and Physical  [name] IPJ:[id number] DOB: 1934-01-13 DOA: 06/14/2014  Referring physician: EDP PCP: Dorris Carnes, MD   Chief Complaint: Cough   HPI: [name] is a 81 y.o. female with year long history of dementia, poor PO intake for several months, generalized weakness, suspicion of ALS.  Patient was just at [facility] last week for treatment of A.Fib RVR, cardioverted.  Since discharge she has developed wet sounding cough, congestion.  No fever nor chills.  Has been put on keflex as outpatient.  Review of Systems: Systems reviewed.  As above, otherwise negative  Past Medical History  Diagnosis Date  . Hypertension   . Dyslipidemia   . Palpitations   . Depression   . Hyperlipidemia   . GERD (gastroesophageal reflux disease)   . Urinary incontinence   . Hiatal hernia   . Renal cysts, acquired, bilateral   . Cholelithiasis   . Renal angiomyolipoma   . Hemorrhoids   . Tubular adenoma   . Heart murmur   . Abdominal pain   . Coronary artery disease   . Atrial fibrillation    Past Surgical History  Procedure Laterality Date  . Eye surgery    . Appendectomy  10/25/10  . Cholecystectomy  10/25/10   Social History:  reports that she has never smoked. She has never used smokeless tobacco. She reports that she does not drink alcohol or use illicit drugs.  Allergies  Allergen Reactions  . Aspirin Anaphylaxis    Family History  Problem Relation Age of Onset  . Heart disease Mother   . Depression Mother   . Cancer Mother     colon cancer  . Hypertension Father   . Heart disease Father   . Cancer Father     prostate cancer     Prior to Admission medications   Medication Sig Start Date End Date Taking? Authorizing Provider  apixaban (ELIQUIS) 5 MG TABS tablet Take 5 mg by mouth 2 (two) times daily.   Yes Historical Provider, MD  Cholecalciferol (VITAMIN D3) 2000 UNITS capsule Take 2,000 Units by mouth daily.   Yes  Historical Provider, MD  Cyanocobalamin (VITAMIN B-12 PO) Take 1 tablet by mouth daily.   Yes Historical Provider, MD  diltiazem (CARDIZEM CD) 120 MG 24 hr capsule Take 120 mg by mouth at bedtime. CARTIA XT   Yes Historical Provider, MD  flecainide (TAMBOCOR) 100 MG tablet Take 150 mg by mouth 2 (two) times daily.  05/13/14  Yes Historical Provider, MD  gabapentin (NEURONTIN) 300 MG capsule Take 300 mg by mouth 3 (three) times daily. 05/27/14  Yes Historical Provider, MD  metoCLOPramide (REGLAN) 5 MG tablet Take 5 mg by mouth 4 (four) times daily -  before meals and at bedtime. 05/25/14  Yes Historical Provider, MD  Multiple Vitamin (MULTIVITAMIN WITH MINERALS) TABS tablet Take 1 tablet by mouth daily.   Yes Historical Provider, MD  polyethylene glycol powder (GLYCOLAX/MIRALAX) powder Take 17 g by mouth daily as needed (constipation). Take 1 scoopful with a large glass of water daily. 05/25/14  Yes Historical Provider, MD  traZODone (DESYREL) 50 MG tablet Take 25 mg by mouth at bedtime. For sleep 06/03/14  Yes Historical Provider, MD   Physical Exam: Filed Vitals:   06/14/14 1915  BP: 165/85  Pulse: 72  Temp:   Resp: 26    BP 165/85 mmHg  Pulse 72  Temp(Src) 98.1 F (36.7 C)  Resp 26  Ht 5\' 4"  (  1.626 m)  Wt 53.524 kg (118 lb)  BMI 20.24 kg/m2  SpO2 96%  General Appearance:    Alert, oriented, no distress, appears stated age  Head:    Normocephalic, atraumatic  Eyes:    PERRL, EOMI, sclera non-icteric        Nose:   Nares without drainage or epistaxis. Mucosa, turbinates normal  Throat:   Moist mucous membranes. Oropharynx without erythema or exudate.  Neck:   Supple. No carotid bruits.  No thyromegaly.  No lymphadenopathy.   Back:     No CVA tenderness, no spinal tenderness  Lungs:     Wet sounding cough, no focal findings on ascultation  Chest wall:    No tenderness to palpitation  Heart:    Regular rate and rhythm without murmurs, gallops, rubs  Abdomen:     Soft, non-tender,  nondistended, normal bowel sounds, no organomegaly  Genitalia:    deferred  Rectal:    deferred  Extremities:   No clubbing, cyanosis or edema.  Pulses:   2+ and symmetric all extremities  Skin:   Skin color, texture, turgor normal, no rashes or lesions  Lymph nodes:   Cervical, supraclavicular, and axillary nodes normal  Neurologic:   CNII-XII intact. Normal strength, sensation and reflexes      throughout    Labs on Admission:  Basic Metabolic Panel:  Recent Labs Lab 06/14/14 1637  NA 136  K 3.9  CL 101  CO2 26  GLUCOSE 146*  BUN 16  CREATININE 0.58  CALCIUM 9.0   Liver Function Tests: No results for input(s): AST, ALT, ALKPHOS, BILITOT, PROT, ALBUMIN in the last 168 hours. No results for input(s): LIPASE, AMYLASE in the last 168 hours. No results for input(s): AMMONIA in the last 168 hours. CBC:  Recent Labs Lab 06/14/14 1637  WBC 11.4*  NEUTROABS 9.7*  HGB 15.0  HCT 45.2  MCV 89.3  PLT 411*   Cardiac Enzymes:  Recent Labs Lab 06/14/14 1637  TROPONINI <0.03    BNP (last 3 results) No results for input(s): PROBNP in the last 8760 hours. CBG: No results for input(s): GLUCAP in the last 168 hours.  Radiological Exams on Admission: Dg Chest Port 1 View  06/14/2014   CLINICAL DATA:  Tachycardia. Cough, 1 week duration. Recent cardiac dysrhythmia. Atrial fibrillation.  EXAM: PORTABLE CHEST - 1 VIEW  COMPARISON:  06/01/2014.  09/30/2013  FINDINGS: Heart size is normal. Mediastinal shadows are normal. There is patchy density in the left lower lobe the could be atelectasis or pneumonia. The right lung is clear. No evidence of edema or effusions.  IMPRESSION: Patchy density in the left lower lobe that could be atelectasis or pneumonia.   Electronically Signed   By: Nelson Chimes M.D.   On: 06/14/2014 17:51    EKG: Independently reviewed.  Assessment/Plan Principal Problem:   HCAP (healthcare-associated pneumonia) Active Problems:   Atrial fibrillation with  RVR   Dementia   Generalized weakness   1. HCAP - 1. pna pathway 2. Cefepime and vanc 3. Cultures pending 2. A.Fib RVR - 1. Continue eliquis 2. cardizem gtt for rate control 3. Dementia and generalized weakness - current suspicion by neurologist is ALS with dementia 4. Anxiety - continue xanax    Code Status: Full  Family Communication: Family at bedside Disposition Plan: Admit to inpatient   Time spent: 27 min  GARDNER, JARED M. [facility] Pager [phone]  If 7AM-7PM, please contact the day team taking care of the patient  [email or web address] 06/14/2014, 7:46 PM

## 2014-06-14 NOTE — ED Notes (Addendum)
Pt is very anxious, states she was prescribed an anxiety medication but has not taken it because it will make her commit suicide. Pt states she felt her heart speed up and it made her more anxious. Husband at bedside, states pt has dementia also.  Per pt family- pt has been losing a lot of weight lately due to dementia and forgetting to eat, loss of appetite as well. Pt family reports she has an appointment to be assessed for ALS. Pt also has been stating to family over the past week that her stomach hurts. Cough noted.

## 2014-06-14 NOTE — ED Provider Notes (Addendum)
CSN: [id number]     Arrival date & time 06/14/14  1628 History   First MD Initiated Contact with Patient 06/14/14 1653     Chief Complaint  Patient presents with  . Tachycardia     (Consider location/radiation/quality/duration/timing/severity/associated sxs/prior Treatment) HPI Comments: Patient with a history of atrial fibrillation on Eliquis and flecainide presents with rapid heart rate. She states that about 6 one half hours ago she started having onset of her rapid heart rate with palpitations. She feels little short of breath and dizzy with the palpitations. She denies any chest pain or tightness. She's had a long-standing history of atrial fibrillation and has seen a cardiologist in [facility]. However she now lives in [place]. She was recently admitted to [facility] hospital a week ago and was cardioverted during this hospital stay on Tuesday. She states that she's taking her medications regularly. She denies any leg swelling or orthopnea. She denies any fevers or chills. There is no cough or chest congestion.   Past Medical History  Diagnosis Date  . Hypertension   . Dyslipidemia   . Palpitations   . Depression   . Hyperlipidemia   . GERD (gastroesophageal reflux disease)   . Urinary incontinence   . Hiatal hernia   . Renal cysts, acquired, bilateral   . Cholelithiasis   . Renal angiomyolipoma   . Hemorrhoids   . Tubular adenoma   . Heart murmur   . Abdominal pain   . Coronary artery disease   . Atrial fibrillation    Past Surgical History  Procedure Laterality Date  . Eye surgery    . Appendectomy  10/25/10  . Cholecystectomy  10/25/10   Family History  Problem Relation Age of Onset  . Heart disease Mother   . Depression Mother   . Cancer Mother     colon cancer  . Hypertension Father   . Heart disease Father   . Cancer Father     prostate cancer   History  Substance Use Topics  . Smoking status: Never Smoker   . Smokeless tobacco: Never Used  .  Alcohol Use: No   OB History    No data available     Review of Systems  Constitutional: Positive for fatigue. Negative for fever, chills and diaphoresis.  HENT: Negative for congestion, rhinorrhea and sneezing.   Eyes: Negative.   Respiratory: Positive for shortness of breath. Negative for cough and chest tightness.   Cardiovascular: Positive for palpitations. Negative for chest pain and leg swelling.  Gastrointestinal: Negative for nausea, vomiting, abdominal pain, diarrhea and blood in stool.  Genitourinary: Negative for frequency, hematuria, flank pain and difficulty urinating.  Musculoskeletal: Negative for back pain and arthralgias.  Skin: Negative for rash.  Neurological: Positive for light-headedness. Negative for dizziness, speech difficulty, weakness, numbness and headaches.      Allergies  Aspirin  Home Medications   Prior to Admission medications   Medication Sig Start Date End Date Taking? Authorizing Provider  apixaban (ELIQUIS) 5 MG TABS tablet Take 5 mg by mouth 2 (two) times daily.   Yes Historical Provider, MD  Cholecalciferol (VITAMIN D3) 2000 UNITS capsule Take 2,000 Units by mouth daily.   Yes Historical Provider, MD  Cyanocobalamin (VITAMIN B-12 PO) Take 1 tablet by mouth daily.   Yes Historical Provider, MD  diltiazem (CARDIZEM CD) 120 MG 24 hr capsule Take 120 mg by mouth at bedtime. CARTIA XT   Yes Historical Provider, MD  flecainide (TAMBOCOR) 100 MG tablet  Take 150 mg by mouth 2 (two) times daily.  05/13/14  Yes Historical Provider, MD  gabapentin (NEURONTIN) 300 MG capsule Take 300 mg by mouth 3 (three) times daily. 05/27/14  Yes Historical Provider, MD  metoCLOPramide (REGLAN) 5 MG tablet Take 5 mg by mouth 4 (four) times daily -  before meals and at bedtime. 05/25/14  Yes Historical Provider, MD  Multiple Vitamin (MULTIVITAMIN WITH MINERALS) TABS tablet Take 1 tablet by mouth daily.   Yes Historical Provider, MD  polyethylene glycol powder  (GLYCOLAX/MIRALAX) powder Take 17 g by mouth daily as needed (constipation). Take 1 scoopful with a large glass of water daily. 05/25/14  Yes Historical Provider, MD  traZODone (DESYREL) 50 MG tablet Take 25 mg by mouth at bedtime. For sleep 06/03/14  Yes Historical Provider, MD   BP 171/87 mmHg  Pulse 90  Temp(Src) 99 F (37.2 C) (Oral)  Resp 28  Ht 5\' 4"  (1.626 m)  Wt 118 lb (53.524 kg)  BMI 20.24 kg/m2  SpO2 97% Physical Exam  Constitutional: She is oriented to person, place, and time. She appears well-developed and well-nourished.  HENT:  Head: Normocephalic and atraumatic.  Eyes: Pupils are equal, round, and reactive to light.  Neck: Normal range of motion. Neck supple.  Cardiovascular: Regular rhythm and normal heart sounds.  Tachycardia present.   Pulmonary/Chest: Effort normal and breath sounds normal. No respiratory distress. She has no wheezes. She has no rales. She exhibits no tenderness.  Abdominal: Soft. Bowel sounds are normal. There is no tenderness. There is no rebound and no guarding.  Musculoskeletal: Normal range of motion. She exhibits no edema.  Lymphadenopathy:    She has no cervical adenopathy.  Neurological: She is alert and oriented to person, place, and time.  Skin: Skin is warm and dry. No rash noted.  Psychiatric: She has a normal mood and affect.    ED Course  Procedures (including critical care time) Labs Review Labs Reviewed  BASIC METABOLIC PANEL - Abnormal; Notable for the following:    Glucose, Bld 146 (*)    All other components within normal limits  BRAIN NATRIURETIC PEPTIDE - Abnormal; Notable for the following:    B Natriuretic Peptide 313.4 (*)    All other components within normal limits  CBC WITH DIFFERENTIAL/PLATELET - Abnormal; Notable for the following:    WBC 11.4 (*)    Platelets 411 (*)    Neutrophils Relative % 85 (*)    Neutro Abs 9.7 (*)    Lymphocytes Relative 9 (*)    All other components within normal limits  CULTURE,  BLOOD (ROUTINE X 2)  CULTURE, BLOOD (ROUTINE X 2)  CULTURE, EXPECTORATED SPUTUM-ASSESSMENT  GRAM STAIN  MRSA PCR SCREENING  TROPONIN I  URINALYSIS, ROUTINE W REFLEX MICROSCOPIC  STREP PNEUMONIAE URINARY ANTIGEN  HIV ANTIBODY (ROUTINE TESTING)  LEGIONELLA ANTIGEN, URINE    Imaging Review Dg Chest Port 1 View  06/14/2014   CLINICAL DATA:  Tachycardia. Cough, 1 week duration. Recent cardiac dysrhythmia. Atrial fibrillation.  EXAM: PORTABLE CHEST - 1 VIEW  COMPARISON:  06/01/2014.  09/30/2013  FINDINGS: Heart size is normal. Mediastinal shadows are normal. There is patchy density in the left lower lobe the could be atelectasis or pneumonia. The right lung is clear. No evidence of edema or effusions.  IMPRESSION: Patchy density in the left lower lobe that could be atelectasis or pneumonia.   Electronically Signed   By: Nelson Chimes M.D.   On: 06/14/2014 17:51  EKG Interpretation   Date/Time:  Sunday Jun 14 2014 16:35:19 EDT Ventricular Rate:  131 PR Interval:    QRS Duration: 146 QT Interval:  362 QTC Calculation: 534 R Axis:   100 Text Interpretation:  Atrial fibrillation with rapid ventricular response  Right bundle branch block Septal infarct , age undetermined Abnormal ECG  changed from prior EKG Confirmed by Harrel Ferrone  MD, Laurenashley Viar ([id number]) on 06/14/2014  5:13:32 PM      MDM   Final diagnoses:  Atrial fibrillation with rapid ventricular response  HCAP (healthcare-associated pneumonia)    Patient presents with atrial fibrillation with RVR. She does have a right bundle branch block which is wider than on her prior EKGs. However last EKG was a few years ago. Her troponin is negative. She has not had any chest discomfort associated with the rapid A. fib. She was given Cardizem bolus and is currently on a Cardizem drip. Her heart rate is improved to the 90s and low 100s on the cardizem drip. She still in atrial fibrillation. I will consult cardiology. Her chest x-ray shows a possible  pneumonia and she does have symptoms of a worsening cough. She was placed on Keflex following her most recent hospitalization at [facility] which she says that her cough is worsening. I will treat her with antibiotics for possible healthcare associated pneumonia.  Cardiology fellow request hospitalist to admit.  Spoke with Dr. Alcario Drought who will admit pt.  CRITICAL CARE Performed by: Kei Mcelhiney Total critical care time: 45 Critical care time was exclusive of separately billable procedures and treating other patients. Critical care was necessary to treat or prevent imminent or life-threatening deterioration. Critical care was time spent personally by me on the following activities: development of treatment plan with patient and/or surrogate as well as nursing, discussions with consultants, evaluation of patient's response to treatment, examination of patient, obtaining history from patient or surrogate, ordering and performing treatments and interventions, ordering and review of laboratory studies, ordering and review of radiographic studies, pulse oximetry and re-evaluation of patient's condition.   Malvin Johns, MD 06/14/14 9169  Malvin Johns, MD 06/14/14 2256

## 2014-06-15 ENCOUNTER — Encounter (HOSPITAL_COMMUNITY): Payer: Self-pay | Admitting: Nurse Practitioner

## 2014-06-15 DIAGNOSIS — E785 Hyperlipidemia, unspecified: Secondary | ICD-10-CM

## 2014-06-15 DIAGNOSIS — I10 Essential (primary) hypertension: Secondary | ICD-10-CM

## 2014-06-15 DIAGNOSIS — E43 Unspecified severe protein-calorie malnutrition: Secondary | ICD-10-CM | POA: Insufficient documentation

## 2014-06-15 DIAGNOSIS — F0390 Unspecified dementia without behavioral disturbance: Secondary | ICD-10-CM

## 2014-06-15 DIAGNOSIS — I4891 Unspecified atrial fibrillation: Secondary | ICD-10-CM | POA: Insufficient documentation

## 2014-06-15 LAB — COMPREHENSIVE METABOLIC PANEL
ALBUMIN: 2.8 g/dL — AB (ref 3.5–5.0)
ALK PHOS: 65 U/L (ref 38–126)
ALT: 18 U/L (ref 14–54)
ANION GAP: 7 (ref 5–15)
AST: 19 U/L (ref 15–41)
BUN: 7 mg/dL (ref 6–20)
CO2: 28 mmol/L (ref 22–32)
CREATININE: 0.54 mg/dL (ref 0.44–1.00)
Calcium: 8.5 mg/dL — ABNORMAL LOW (ref 8.9–10.3)
Chloride: 102 mmol/L (ref 101–111)
GFR calc non Af Amer: >60 mL/min (ref >60)
GLUCOSE: 107 mg/dL — AB (ref 70–99)
Potassium: 3.7 mmol/L (ref 3.5–5.1)
Sodium: 137 mmol/L (ref 135–145)
TOTAL PROTEIN: 5 g/dL — AB (ref 6.5–8.1)
Total Bilirubin: 0.7 mg/dL (ref 0.3–1.2)

## 2014-06-15 LAB — CBC
HEMATOCRIT: 43.3 % (ref 36.0–46.0)
Hemoglobin: 14.1 g/dL (ref 12.0–15.0)
MCH: 30 pg (ref 26.0–34.0)
MCHC: 32.6 g/dL (ref 30.0–36.0)
MCV: 92.1 fL (ref 78.0–100.0)
PLATELETS: 343 10*3/uL (ref 150–400)
RBC: 4.7 MIL/uL (ref 3.87–5.11)
RDW: 13.7 % (ref 11.5–15.5)
WBC: 9.3 10*3/uL (ref 4.0–10.5)

## 2014-06-15 LAB — HIV ANTIBODY (ROUTINE TESTING W REFLEX): HIV SCREEN 4TH GENERATION: NONREACTIVE

## 2014-06-15 LAB — MAGNESIUM: MAGNESIUM: 1.8 mg/dL (ref 1.7–2.4)

## 2014-06-15 MED ORDER — DILTIAZEM HCL ER COATED BEADS 120 MG PO CP24
120.0000 mg | ORAL_CAPSULE | Freq: Every day | ORAL | Status: DC
  Administered 2014-06-15: 120 mg via ORAL
  Filled 2014-06-15 (×2): qty 1

## 2014-06-15 MED ORDER — ONDANSETRON HCL 4 MG/2ML IJ SOLN
4.0000 mg | Freq: Four times a day (QID) | INTRAMUSCULAR | Status: DC
  Administered 2014-06-15: 4 mg via INTRAVENOUS

## 2014-06-15 MED ORDER — ENSURE ENLIVE PO LIQD
237.0000 mL | Freq: Three times a day (TID) | ORAL | Status: DC
  Administered 2014-06-15 – 2014-06-19 (×12): 237 mL via ORAL

## 2014-06-15 MED ORDER — ONDANSETRON HCL 4 MG/2ML IJ SOLN
4.0000 mg | Freq: Four times a day (QID) | INTRAMUSCULAR | Status: DC | PRN
  Administered 2014-06-15 – 2014-06-18 (×4): 4 mg via INTRAVENOUS
  Filled 2014-06-15 (×4): qty 2

## 2014-06-15 NOTE — [place] Notes (Signed)
Pt to [place] to 3W-03, called report. Family present and aware of [name].

## 2014-06-15 NOTE — [place] Notes (Addendum)
Patient Name: [name] Date of Encounter: 06/15/2014     Principal Problem:   HCAP (healthcare-associated pneumonia) Active Problems:   Atrial fibrillation with RVR   Essential hypertension   Dementia   Dyslipidemia   Generalized weakness    SUBJECTIVE  No c/p.  Coughing some with c/o dyspnea.  O2 sats in mid 90's.  Still in afib.  CURRENT MEDS . apixaban  5 mg Oral BID  . ceFEPime (MAXIPIME) IV  1 g Intravenous Q24H  . cholecalciferol  2,000 Units Oral Daily  . diltiazem  120 mg Oral QHS  . flecainide  150 mg Oral BID  . gabapentin  300 mg Oral TID  . metoCLOPramide  5 mg Oral TID AC & HS  . multivitamin with minerals  1 tablet Oral Daily  . traZODone  25 mg Oral QHS  . vancomycin  500 mg Intravenous Q12H    OBJECTIVE  Filed Vitals:   06/15/14 0545 06/15/14 0600 06/15/14 0700 06/15/14 0742  BP: 107/54 104/58 121/67   Pulse: 96 66 73   Temp:    97.8 F (36.6 C)  TempSrc:    Oral  Resp: 13 19 12    Height:      Weight:      SpO2: 95% 97% 97%     Intake/Output Summary (Last 24 hours) at 06/15/14 0928 Last data filed at 06/14/14 2345  Gross per 24 hour  Intake      0 ml  Output   1175 ml  Net  -1175 ml   Filed Weights   06/14/14 1637 06/14/14 2056  Weight: 118 lb (53.524 kg) 119 lb 7.8 oz (54.2 kg)    PHYSICAL EXAM  General: Pleasant, NAD. Neuro: Alert and oriented X 3. Moves all extremities spontaneously. Psych: Flat affect. HEENT:  Normal  Neck: Supple without bruits or JVD. Lungs:  Resp regular and unlabored, scattered rhonchi throughout.  Diminished breath sounds on right. Heart: IR, IR, no s3, s4, 3/6 Syst murmur heard throughout. Abdomen: Soft, non-tender, non-distended, BS + x 4.  Extremities: No clubbing, cyanosis or edema. DP/PT/Radials 2+ and equal bilaterally.  Accessory Clinical Findings  CBC  Recent Labs  06/14/14 1637 06/15/14 0831  WBC 11.4* 9.3  NEUTROABS 9.7*  --   HGB 15.0 14.1  HCT 45.2 43.3  MCV 89.3 92.1    PLT 411* 637   Basic Metabolic Panel  Recent Labs  06/14/14 1637 06/15/14 0831  NA 136 137  K 3.9 3.7  CL 101 102  CO2 26 28  GLUCOSE 146* 107*  BUN 16 7  CREATININE 0.58 0.54  CALCIUM 9.0 8.5*  MG  --  1.8   Liver Function Tests  Recent Labs  06/15/14 0831  AST 19  ALT 18  ALKPHOS 65  BILITOT 0.7  PROT 5.0*  ALBUMIN 2.8*   Cardiac Enzymes  Recent Labs  06/14/14 1637  TROPONINI <0.03   TELE  Afib, 80's to low 100's.  Radiology/Studies  Dg Chest Port 1 View  06/14/2014   CLINICAL DATA:  Tachycardia. Cough, 1 week duration. Recent cardiac dysrhythmia. Atrial fibrillation.  EXAM: PORTABLE CHEST - 1 VIEW  COMPARISON:  06/01/2014.  09/30/2013  FINDINGS: Heart size is normal. Mediastinal shadows are normal. There is patchy density in the left lower lobe the could be atelectasis or pneumonia. The right lung is clear. No evidence of edema or effusions.  IMPRESSION: Patchy density in the left lower lobe that could be atelectasis or pneumonia.  Electronically Signed   By: Nelson Chimes M.D.   On: 06/14/2014 17:51    ASSESSMENT AND PLAN  1.  HCAP:  Abx per IM.  2.  PAF RVR:  S/p DCCV in [place] on 5/3.  She had been on flecainided 100 bid and this was increased to 150 mg BID last week.  Anticoagulated with eliquis (CHA2DS2VASc = 4).  Rate reasonably controlled on oral dilt and flecainide 150 bid.  Hopefully, with further clinical improvement from #1, rates will be easier to control and perhaps she will convert.  If not, consider repeat DCCV.  3.  Essential HTN:  Stable.  4. Dementia: output neuro w/u ongoing.  Signed, Murray Hodgkins NP   I have examined the patient and reviewed assessment and plan and discussed with patient.  Agree with above as stated.  Not maintaining NSR despite high dose flecainide.  Given that she is quite small physically and that she is on the highest dose of flecainide, I think she is at higher risk of side effects. Will stop the flecainide  since she is not maintaining normal sinus rhythm. Consider starting amiodarone in the next couple of days.   Kilea Mccarey S.

## 2014-06-15 NOTE — [place] Notes (Signed)
PATIENT DETAILS Name: [name] Age: 81 y.o. Sex: female Date of Birth: 03/30/1933 Admit Date: 06/14/2014 Admitting Physician Etta Quill, DO XTG:GYIRS Harrington Challenger, MD  Subjective: Still continues to cough, weakness not any better. Off Cardizem infusion  Assessment/Plan: Principal Problem:   HCAP (healthcare-associated pneumonia): Afebrile, leukocytosis has resolved. Given history of possible ALS with dementia, some suspicion for aspiration-continue current antibiotics. Obtain SLP evaluation. Follow culture data-blood cultures so far negative. Urine streptococcal antigen, Legionella antigen pending. HIV pending as well.  Active Problems:   Atrial fibrillation with RVR: Rate better controlled-no longer requiring IV Cardizem infusion-now on oral Cardizem and flecainide.CHA2DS2VASc = 4. Anticoagulated with Eliquis. Cardiology following, and defer timing of cardioversion cardiology.     Essential hypertension: Controlled with oral Cardizem. Follow and titrate accordingly.    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 Suspect worsening generalized weakness from acute illness/pneumonia    Generalized weakness: See above-obtain PT evaluation. Currently low suspicion for respiratory fatigue at this time.  Disposition: Remain inpatient-transfer to cardiac telemetry  Antimicrobial agents  See below  Anti-infectives    Start     Dose/Rate Route Frequency Ordered Stop   06/15/14 1800  ceFEPIme (MAXIPIME) 1 g in dextrose 5 % 50 mL IVPB     1 g 100 mL/hr over 30 Minutes Intravenous Every 24 hours 06/14/14 1821     06/15/14 0700  vancomycin (VANCOCIN) 500 mg in sodium chloride 0.9 % 100 mL IVPB     500 mg 100 mL/hr over 60 Minutes Intravenous Every 12 hours 06/14/14 1821     06/14/14 1830  vancomycin (VANCOCIN) IVPB 1000 mg/200 mL premix     1,000 mg 200 mL/hr over 60 Minutes Intravenous NOW 06/14/14 1821 06/15/14 0212   06/14/14 1815  ceFEPIme (MAXIPIME) 1 g in dextrose 5 % 50 mL IVPB     1 g 100 mL/hr over 30 Minutes Intravenous  Once 06/14/14 1811 06/14/14 2011      DVT Prophylaxis: Eliquis  Family Communication Daughter at bedside  Procedures: None  CONSULTS:  cardiology  Time spent 30 minutes-Greater than 50% of this time was spent in counseling, explanation of diagnosis, planning of further management, and coordination of care.  MEDICATIONS: Scheduled Meds: . apixaban  5 mg Oral BID  . ceFEPime (MAXIPIME) IV  1 g Intravenous Q24H  . cholecalciferol  2,000 Units Oral Daily  . diltiazem  120 mg Oral QHS  . flecainide  150 mg Oral BID  . gabapentin  300 mg Oral TID  . metoCLOPramide  5 mg Oral TID AC & HS  . multivitamin with minerals  1 tablet Oral Daily  . traZODone  25 mg Oral QHS  . vancomycin  500 mg Intravenous Q12H   Continuous Infusions:  PRN Meds:.ALPRAZolam, ondansetron, polyethylene glycol    PHYSICAL EXAM: Vital signs in last 24 hours: Filed Vitals:   06/15/14 0545 06/15/14 0600 06/15/14 0700 06/15/14 0742  BP: 107/54 104/58 121/67   Pulse: 96 66 73   Temp:    97.8 F (36.6 C)  TempSrc:    Oral  Resp: 13 19 12    Height:      Weight:      SpO2: 95% 97% 97%     Weight change:  Filed Weights   06/14/14 1637 06/14/14 2056  Weight: 53.524 kg (118 lb) 54.2 kg (119 lb 7.8 oz)   Body mass  index is 20.5 kg/(m^2).   Gen Exam: Awake and alert with clear speech.  Chronically sick appearing Neck: Supple, No JVD.   Chest: B/L Clear.   CVS: S1 S2 irregular, no murmurs.  Abdomen: soft, BS +, non tender, non distended.  Extremities: no edema, lower extremities warm to touch. Neurologic: Non Focal-blocker generalized weakness Skin: No Rash.   Wounds: N/A.   Intake/Output from previous day:  Intake/Output Summary (Last 24 hours) at 06/15/14 1114 Last data filed at 06/15/14 0900  Gross per 24 hour  Intake    240 ml  Output   1175 ml  Net   -935 ml     LAB  RESULTS: CBC  Recent Labs Lab 06/14/14 1637 06/15/14 0831  WBC 11.4* 9.3  HGB 15.0 14.1  HCT 45.2 43.3  PLT 411* 343  MCV 89.3 92.1  MCH 29.6 30.0  MCHC 33.2 32.6  RDW 13.8 13.7  LYMPHSABS 1.0  --   MONOABS 0.7  --   EOSABS 0.0  --   BASOSABS 0.0  --     Chemistries   Recent Labs Lab 06/14/14 1637 06/15/14 0831  NA 136 137  K 3.9 3.7  CL 101 102  CO2 26 28  GLUCOSE 146* 107*  BUN 16 7  CREATININE 0.58 0.54  CALCIUM 9.0 8.5*  MG  --  1.8    CBG: No results for input(s): GLUCAP in the last 168 hours.  GFR Estimated Creatinine Clearance: 48.8 mL/min (by C-G formula based on Cr of 0.54).  Coagulation profile No results for input(s): INR, PROTIME in the last 168 hours.  Cardiac Enzymes  Recent Labs Lab 06/14/14 1637  TROPONINI <0.03    Invalid input(s): POCBNP No results for input(s): DDIMER in the last 72 hours. No results for input(s): HGBA1C in the last 72 hours. No results for input(s): CHOL, HDL, LDLCALC, TRIG, CHOLHDL, LDLDIRECT in the last 72 hours. No results for input(s): TSH, T4TOTAL, T3FREE, THYROIDAB in the last 72 hours.  Invalid input(s): FREET3 No results for input(s): VITAMINB12, FOLATE, FERRITIN, TIBC, IRON, RETICCTPCT in the last 72 hours. No results for input(s): LIPASE, AMYLASE in the last 72 hours.  Urine Studies No results for input(s): UHGB, CRYS in the last 72 hours.  Invalid input(s): UACOL, UAPR, USPG, UPH, UTP, UGL, UKET, UBIL, UNIT, UROB, ULEU, UEPI, UWBC, URBC, UBAC, CAST, UCOM, BILUA  MICROBIOLOGY: Recent Results (from the past 240 hour(s))  Culture, blood (routine x 2)     Status: None (Preliminary result)   Collection Time: 06/14/14  7:18 PM  Result Value Ref Range Status   Specimen Description BLOOD LEFT HAND  Final   Special Requests BOTTLES DRAWN AEROBIC ONLY 3CC  Final   Culture   Final           BLOOD CULTURE RECEIVED NO GROWTH TO DATE CULTURE WILL BE HELD FOR 5 DAYS BEFORE ISSUING A FINAL NEGATIVE  REPORT Note: Culture results may be compromised due to an inadequate volume of blood received in culture bottles. Performed at [facility]    Report Status PENDING  Incomplete  Culture, blood (routine x 2)     Status: None (Preliminary result)   Collection Time: 06/14/14  7:23 PM  Result Value Ref Range Status   Specimen Description BLOOD RIGHT HAND  Final   Special Requests BOTTLES DRAWN AEROBIC AND ANAEROBIC 5CC  Final   Culture   Final           BLOOD CULTURE RECEIVED NO  GROWTH TO DATE CULTURE WILL BE HELD FOR 5 DAYS BEFORE ISSUING A FINAL NEGATIVE REPORT Performed at [facility]    Report Status PENDING  Incomplete  MRSA PCR Screening     Status: None   Collection Time: 06/14/14  9:38 PM  Result Value Ref Range Status   MRSA by PCR NEGATIVE NEGATIVE Final    Comment:        The GeneXpert MRSA Assay (FDA approved for NASAL specimens only), is one component of a comprehensive MRSA colonization surveillance program. It is not intended to diagnose MRSA infection nor to guide or monitor treatment for MRSA infections.     RADIOLOGY STUDIES/RESULTS: Dg Chest Port 1 View  06/14/2014   CLINICAL DATA:  Tachycardia. Cough, 1 week duration. Recent cardiac dysrhythmia. Atrial fibrillation.  EXAM: PORTABLE CHEST - 1 VIEW  COMPARISON:  06/01/2014.  09/30/2013  FINDINGS: Heart size is normal. Mediastinal shadows are normal. There is patchy density in the left lower lobe the could be atelectasis or pneumonia. The right lung is clear. No evidence of edema or effusions.  IMPRESSION: Patchy density in the left lower lobe that could be atelectasis or pneumonia.   Electronically Signed   By: Nelson Chimes M.D.   On: 06/14/2014 17:51    Oren Binet, MD  [facility] Pager:[phone]  If 7PM-7AM, please contact night-coverage [email or web address] Password [email or web address] 06/15/2014, 11:14 AM   LOS: 1 day

## 2014-06-15 NOTE — [place] Notes (Addendum)
Initial Nutrition Assessment  DOCUMENTATION CODES:  Severe malnutrition in context of chronic illness  INTERVENTION:  Ensure Enlive (each supplement provides 350kcal and 20 grams of protein) TID between meals.  NUTRITION DIAGNOSIS:  Malnutrition related to chronic illness as evidenced by severe depletion of muscle mass, percent weight loss.   GOAL:  Patient will meet greater than or equal to 90% of their needs   MONITOR:  PO intake, Supplement acceptance, Labs, Weight trends  REASON FOR ASSESSMENT:  Malnutrition Screening Tool    ASSESSMENT:  Patient admitted on 5/8 with persistent cough. Hx of dementia x 1 year, poor PO intake for several months, generalized weakness, suspicion of ALS. Recently admitted to [facility] for A Fib with RVR.   Per discussion with patient and her husband, she has been eating very poorly for several months and has lost 35 lbs in the past 6-7 months. She usually drinks 4 Boost High Protein supplements per day and eats small amounts of meals. She endorses poor appetite, questionable medication-related. Flecainide to be discontinued per Cardiology.  Height:  Ht Readings from Last 1 Encounters:  06/14/14 5\' 4"  (1.626 m)    Weight:  Wt Readings from Last 1 Encounters:  06/14/14 119 lb 7.8 oz (54.2 kg)    Ideal Body Weight:  54.5 kg  Wt Readings from Last 10 Encounters:  06/14/14 119 lb 7.8 oz (54.2 kg)  10/07/13 127 lb 3.3 oz (57.7 kg)  09/18/13 129 lb (58.514 kg)  12/11/12 138 lb (62.596 kg)  03/14/12 146 lb 9.6 oz (66.497 kg)  01/15/12 144 lb (65.318 kg)  01/09/12 141 lb 9.6 oz (64.229 kg)  12/05/11 138 lb 12.8 oz (62.959 kg)  06/20/11 147 lb 3.2 oz (66.769 kg)  11/22/10 142 lb 3.2 oz (64.501 kg)    BMI:  Body mass index is 20.5 kg/(m^2).  Estimated Nutritional Needs:  Kcal:  1500-1700  Protein:  75-85 gm  Fluid:  > 1.5 L  Skin:  Reviewed, no issues  Diet Order:  Diet regular Room service appropriate?: Yes; Fluid  consistency:: Thin  EDUCATION NEEDS:  Education needs addressed   Intake/Output Summary (Last 24 hours) at 06/15/14 1453 Last data filed at 06/15/14 1224  Gross per 24 hour  Intake    580 ml  Output   1525 ml  Net   -945 ml    Last BM:  5/8   Molli Barrows, RD, LDN, [place] Pager [phone] After Hours Pager [phone]

## 2014-06-16 ENCOUNTER — Inpatient Hospital Stay (HOSPITAL_COMMUNITY): Payer: Medicare Other

## 2014-06-16 DIAGNOSIS — E43 Unspecified severe protein-calorie malnutrition: Secondary | ICD-10-CM

## 2014-06-16 LAB — CBC
HCT: 42.3 % (ref 36.0–46.0)
HEMOGLOBIN: 13.2 g/dL (ref 12.0–15.0)
MCH: 28.9 pg (ref 26.0–34.0)
MCHC: 31.2 g/dL (ref 30.0–36.0)
MCV: 92.6 fL (ref 78.0–100.0)
Platelets: 317 10*3/uL (ref 150–400)
RBC: 4.57 MIL/uL (ref 3.87–5.11)
RDW: 13.8 % (ref 11.5–15.5)
WBC: 13 10*3/uL — AB (ref 4.0–10.5)

## 2014-06-16 LAB — BASIC METABOLIC PANEL
ANION GAP: 6 (ref 5–15)
BUN: 12 mg/dL (ref 6–20)
CALCIUM: 8.6 mg/dL — AB (ref 8.9–10.3)
CHLORIDE: 102 mmol/L (ref 101–111)
CO2: 32 mmol/L (ref 22–32)
CREATININE: 0.54 mg/dL (ref 0.44–1.00)
GFR calc Af Amer: >60 mL/min (ref >60)
GFR calc non Af Amer: >60 mL/min (ref >60)
GLUCOSE: 89 mg/dL (ref 70–99)
Potassium: 4.5 mmol/L (ref 3.5–5.1)
Sodium: 140 mmol/L (ref 135–145)

## 2014-06-16 LAB — LEGIONELLA ANTIGEN, URINE

## 2014-06-16 MED ORDER — RESOURCE THICKENUP CLEAR PO POWD
ORAL | Status: DC | PRN
  Administered 2014-06-16: 15:00:00 via ORAL
  Filled 2014-06-16: qty 125

## 2014-06-16 MED ORDER — ALBUTEROL SULFATE (2.5 MG/3ML) 0.083% IN NEBU: 2.5000 mg | INHALATION_SOLUTION | RESPIRATORY_TRACT | Status: DC | PRN

## 2014-06-16 MED ORDER — DILTIAZEM HCL ER COATED BEADS 180 MG PO CP24
180.0000 mg | ORAL_CAPSULE | Freq: Every day | ORAL | Status: DC
  Administered 2014-06-16 – 2014-06-18 (×3): 180 mg via ORAL
  Filled 2014-06-16 (×3): qty 1

## 2014-06-16 MED ORDER — DM-GUAIFENESIN ER 30-600 MG PO TB12
1.0000 | ORAL_TABLET | Freq: Two times a day (BID) | ORAL | Status: DC
  Administered 2014-06-16 – 2014-06-19 (×7): 1 via ORAL
  Filled 2014-06-16 (×7): qty 1

## 2014-06-16 MED ORDER — ACETAMINOPHEN 325 MG PO TABS: 650.0000 mg | ORAL_TABLET | Freq: Four times a day (QID) | ORAL | Status: DC | PRN

## 2014-06-16 NOTE — Evaluation (Signed)
Physical Therapy Evaluation Patient Details Name: [name] MRN: [id number] DOB: November 01, 1933 Today's Date: 06/16/2014   History of Present Illness  Pt adm with PNA and a-fib with RVR. Pt also with dementia and in process of work up for ALS.  Clinical Impression  Pt admitted with above diagnosis and presents to PT with functional limitations due to deficits listed below (See PT problem list). Pt needs skilled PT to maximize independence and safety to allow discharge to home with support of family. Family able and willing to assist pt with amb in room and in halls while pt hospitalized.     Follow Up Recommendations No PT follow up    Equipment Recommendations  None recommended by PT    Recommendations for Other Services       Precautions / Restrictions Precautions Precautions: Fall      Mobility  Bed Mobility Overal bed mobility: Needs Assistance Bed Mobility: Supine to Sit;Sit to Supine     Supine to sit: Supervision        Transfers Overall transfer level: Needs assistance Equipment used: None Transfers: Sit to/from Stand Sit to Stand: Min guard         General transfer comment: Assist for balance and safety.  Ambulation/Gait Ambulation/Gait assistance: Min guard Ambulation Distance (Feet): 250 Feet Assistive device: 1 person hand held assist;None Gait Pattern/deviations: Step-through pattern     General Gait Details: Pt slightly unsteady but no LOB  Stairs            Wheelchair Mobility    Modified Rankin (Stroke Patients Only)       Balance Overall balance assessment: Needs assistance Sitting-balance support: No upper extremity supported;Feet supported Sitting balance-Leahy Scale: Normal     Standing balance support: No upper extremity supported;During functional activity Standing balance-Leahy Scale: Good                               Pertinent Vitals/Pain Pain Assessment: No/denies pain    Home Living  Family/patient expects to be discharged to:: Private residence Living Arrangements: Spouse/significant other Available Help at Discharge: Family;Available 24 hours/day Type of Home: House Home Access: Stairs to enter   [employer] of Steps: 3-4 Home Layout: One level Home Equipment: None      Prior Function Level of Independence: Independent with assistive device(s)               Hand Dominance        Extremity/Trunk Assessment   Upper Extremity Assessment: Overall WFL for tasks assessed           Lower Extremity Assessment: Generalized weakness         Communication      Cognition Arousal/Alertness: Awake/alert Behavior During Therapy: WFL for tasks assessed/performed Overall Cognitive Status: History of cognitive impairments - at baseline                      General Comments      Exercises        Assessment/Plan    PT Assessment Patient needs continued PT services  PT Diagnosis Difficulty walking;Generalized weakness   PT Problem List Decreased strength;Decreased activity tolerance;Decreased balance;Decreased mobility  PT Treatment Interventions DME instruction;Gait training;Functional mobility training;Balance training;Therapeutic exercise;Therapeutic activities;Patient/family education   PT Goals (Current goals can be found in the Care Plan section) Acute Rehab PT Goals Patient Stated Goal: go home PT Goal Formulation: With patient Time For  Goal Achievement: 06/23/14 Potential to Achieve Goals: Good    Frequency Min 3X/week   Barriers to discharge        Co-evaluation               End of Session Equipment Utilized During Treatment: Gait belt Activity Tolerance: Patient tolerated treatment well Patient left: in bed;with call bell/phone within reach;with family/visitor present Nurse Communication: Mobility status         Time: 1600-1611 PT Time Calculation (min) (ACUTE ONLY): 11 min   Charges:   PT  Evaluation $Initial PT Evaluation Tier I: 1 Procedure     PT G Codes:        [name] 06/28/2014, 4:51 PM  [facility] PT [phone]

## 2014-06-16 NOTE — Progress Notes (Signed)
Speech Pathology:  MBS completed and accessible in imaging.  Pt with aspiration of thin liquids; diet changed to mechanical soft, nectar-thick for now.  PLEASE ORDER OP SLP SO THAT PATIENT MAY BE SCHEDULED FOR APPT AFTER NEUROLOGY F/U ON 5/19.  Thanks, Lain Tetterton L. Tivis Ringer, [place] CCC/SLP Pager [phone]

## 2014-06-16 NOTE — Progress Notes (Signed)
UR Completed Shalena Ezzell Graves-Bigelow, RN,BSN [phone]  

## 2014-06-16 NOTE — Evaluation (Signed)
Clinical/Bedside Swallow Evaluation Patient Details  Name: [name] MRN: [id number] Date of Birth: 16-Aug-1933  Today's Date: 06/16/2014 Time: SLP Start Time (ACUTE ONLY): 1033 SLP Stop Time (ACUTE ONLY): 1053 SLP Time Calculation (min) (ACUTE ONLY): 20 min  Past Medical History:  Past Medical History  Diagnosis Date  . Hypertension   . Dyslipidemia   . Palpitations   . Depression   . Hyperlipidemia   . GERD (gastroesophageal reflux disease)   . Urinary incontinence   . Hiatal hernia   . Renal cysts, acquired, bilateral   . Cholelithiasis   . Renal angiomyolipoma   . Hemorrhoids   . Tubular adenoma   . Heart murmur   . Abdominal pain   . Coronary artery disease   . PAF (paroxysmal atrial fibrillation)     a.  CHA2DS2VASc = 4-->Eliquis.   Past Surgical History:  Past Surgical History  Procedure Laterality Date  . Eye surgery    . Appendectomy  10/25/10  . Cholecystectomy  10/25/10   HPI:  81 y.o. female with year long history of dementia, poor PO intake for several months, generalized weakness, suspicion of ALS. Patient was just at [facility] last week for treatment of A.Fib RVR, cardioverted. Since discharge she has developed wet sounding cough, congestion.  MD has concerns for aspiration given hx of dementia as well as possible ALS.  Daughter reports intermittent cough when swallowing; rapid cognitive decline since January.     Assessment / Plan / Recommendation Clinical Impression  Pt presents with constant coughing prior to and throughout assessment, making it difficult to distinguish s/s of aspiration.  Presents with mild bilateral  weakness jaw, palatal elevation.  Given dx of pna, ongoing work-up for ALS, recommend proceeding with MBS for more objective assessment of swallow function.  Dtr is in agreement.  Pt scheduled for 1330 today.      Aspiration Risk  Mild    Diet Recommendation  (continue regular, thin pending today's study)   Medication  Administration: Whole meds with puree    Other  Recommendations Oral Care Recommendations: Oral care BID   SLP Swallow Goals     Swallow Study Prior Functional Status       General Other Pertinent Information: 81 y.o. female with year long history of dementia, poor PO intake for several months, generalized weakness, suspicion of ALS. Patient was just at [facility] last week for treatment of A.Fib RVR, cardioverted. Since discharge she has developed wet sounding cough, congestion.  MD has concerns for aspiration given hx of dementia as well as possible ALS.  Daughter reports intermittent cough when swallowing; rapid cognitive decline since January.   Type of Study: Bedside swallow evaluation Previous Swallow Assessment: none per records Diet Prior to this Study: Regular;Thin liquids Temperature Spikes Noted: No Respiratory Status: Supplemental O2 delivered via (comment) (nasal cannula) History of Recent Intubation: No Behavior/Cognition: Alert;Cooperative;Confused Oral Cavity - Dentition: Adequate natural dentition/normal for age Self-Feeding Abilities: Able to feed self Patient Positioning: Upright in bed Baseline Vocal Quality: Hoarse Volitional Cough: Strong Volitional Swallow: Able to elicit    Oral/Motor/Sensory Function Overall Oral Motor/Sensory Function: Other (comment) (bilateral weakness V; reduced palatal elevation)   Ice Chips Ice chips: Within functional limits   Thin Liquid Thin Liquid: Impaired Presentation: Self Fed;Straw Pharyngeal  Phase Impairments: Multiple swallows;Cough - Immediate    Nectar Thick Nectar Thick Liquid: Not tested   Honey Thick Honey Thick Liquid: Not tested   Puree Puree: Impaired Pharyngeal Phase  Impairments: Multiple swallows;Cough - Delayed   Solid  Kyree Adriano L. [place], [place] CCC/SLP Pager [phone]     Solid: Not tested       Juan Quam Laurice 06/16/2014,11:00 AM

## 2014-06-16 NOTE — Progress Notes (Addendum)
PATIENT DETAILS Name: [name] Age: 81 y.o. Sex: female Date of Birth: 1933/08/08 Admit Date: 06/14/2014 Admitting Physician Etta Quill, DO HBZ:JIRCV Harrington Challenger, MD  Subjective: Still continues to cough, continues to have weakness-per Dr.-patient ambulated around the hallway yesterday  Assessment/Plan: Principal Problem:   HCAP (healthcare-associated pneumonia): Afebrile, leukocytosis has resolved. Given history of possible ALS with dementia, some suspicion for aspiration-continue current antibiotics. Await modified barium swallow.Blood cultures so far negative. Urine streptococcal antigen, Legionella antigen pending. HIV negative as well.  Active Problems:   Atrial fibrillation with RVR: Rate better required IV Cardizem infusion on admission-now on oral Cardizem. Previously was on flecainide-which has not been discontinued by cardiology, plans are to start amiodarone at some point.CHA2DS2VASc = 4. Anticoagulated with Eliquis. Cardiology following, and defer timing of cardioversion cardiology.     Essential hypertension: Controlled with oral Cardizem. Follow and titrate accordingly.    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 Suspect worsening generalized weakness from acute illness/pneumonia    Generalized weakness: See above-obtain PT evaluation. Currently low suspicion for respiratory fatigue at this time.    Severe protein calorie malnutrition: will continue with supplements  Disposition: Remain inpatient-requires a few more days of IV antibiotics-PT evaluation still pending to determine disposition  Antimicrobial agents  See below  Anti-infectives    Start     Dose/Rate Route Frequency Ordered Stop   06/15/14 1800  ceFEPIme (MAXIPIME) 1 g in dextrose 5 % 50 mL IVPB     1 g 100 mL/hr over 30 Minutes Intravenous Every 24 hours  06/14/14 1821     06/15/14 0700  vancomycin (VANCOCIN) 500 mg in sodium chloride 0.9 % 100 mL IVPB     500 mg 100 mL/hr over 60 Minutes Intravenous Every 12 hours 06/14/14 1821     06/14/14 1830  vancomycin (VANCOCIN) IVPB 1000 mg/200 mL premix     1,000 mg 200 mL/hr over 60 Minutes Intravenous NOW 06/14/14 1821 06/15/14 0212   06/14/14 1815  ceFEPIme (MAXIPIME) 1 g in dextrose 5 % 50 mL IVPB     1 g 100 mL/hr over 30 Minutes Intravenous  Once 06/14/14 1811 06/14/14 2011      DVT Prophylaxis: Eliquis  Family Communication Daughter at bedside  Procedures: None  CONSULTS:  cardiology  Time spent 25 minutes-Greater than 50% of this time was spent in counseling, explanation of diagnosis, planning of further management, and coordination of care.  MEDICATIONS: Scheduled Meds: . apixaban  5 mg Oral BID  . ceFEPime (MAXIPIME) IV  1 g Intravenous Q24H  . cholecalciferol  2,000 Units Oral Daily  . diltiazem  120 mg Oral QHS  . feeding supplement (ENSURE ENLIVE)  237 mL Oral TID BM  . gabapentin  300 mg Oral TID  . metoCLOPramide  5 mg Oral TID AC & HS  . multivitamin with minerals  1 tablet Oral Daily  . traZODone  25 mg Oral QHS  . vancomycin  500 mg Intravenous Q12H   Continuous Infusions:  PRN Meds:.ALPRAZolam, ondansetron (ZOFRAN) IV, polyethylene glycol    PHYSICAL EXAM: Vital signs in last 24 hours: Filed Vitals:   06/15/14 1200 06/15/14 1445 06/15/14 2100 06/16/14 0500  BP:  140/69 149/65 106/53  Pulse: 95 73 93 80  Temp:  98.5 F (36.9 C) 98.2 F (36.8 C) 98.1 F (36.7  C)  TempSrc:  Oral    Resp: 24 16 20 21   Height:      Weight:      SpO2: 96% 94% 96% 91%    Weight change:  Filed Weights   06/14/14 1637 06/14/14 2056  Weight: 53.524 kg (118 lb) 54.2 kg (119 lb 7.8 oz)   Body mass index is 20.5 kg/(m^2).   Gen Exam: Awake and alert with clear speech.  Chronically sick appearing. Weak voice-often quite a lot. Neck: Supple, No JVD.   Chest: B/L  Clear.  Few bibasilar rales CVS: S1 S2 irregular, no murmurs.  Abdomen: soft, BS +, non tender, non distended.  Extremities: no edema, lower extremities warm to touch. Neurologic: Non Focal-blocker generalized weakness Skin: No Rash.   Wounds: N/A.   Intake/Output from previous day:  Intake/Output Summary (Last 24 hours) at 06/16/14 1226 Last data filed at 06/16/14 0940  Gross per 24 hour  Intake    360 ml  Output    200 ml  Net    160 ml     LAB RESULTS: CBC  Recent Labs Lab 06/14/14 1637 06/15/14 0831 06/16/14 0755  WBC 11.4* 9.3 13.0*  HGB 15.0 14.1 13.2  HCT 45.2 43.3 42.3  PLT 411* 343 317  MCV 89.3 92.1 92.6  MCH 29.6 30.0 28.9  MCHC 33.2 32.6 31.2  RDW 13.8 13.7 13.8  LYMPHSABS 1.0  --   --   MONOABS 0.7  --   --   EOSABS 0.0  --   --   BASOSABS 0.0  --   --     Chemistries   Recent Labs Lab 06/14/14 1637 06/15/14 0831 06/16/14 0755  NA 136 137 140  K 3.9 3.7 4.5  CL 101 102 102  CO2 26 28 32  GLUCOSE 146* 107* 89  BUN 16 7 12   CREATININE 0.58 0.54 0.54  CALCIUM 9.0 8.5* 8.6*  MG  --  1.8  --     CBG: No results for input(s): GLUCAP in the last 168 hours.  GFR Estimated Creatinine Clearance: 48.8 mL/min (by C-G formula based on Cr of 0.54).  Coagulation profile No results for input(s): INR, PROTIME in the last 168 hours.  Cardiac Enzymes  Recent Labs Lab 06/14/14 1637  TROPONINI <0.03    Invalid input(s): POCBNP No results for input(s): DDIMER in the last 72 hours. No results for input(s): HGBA1C in the last 72 hours. No results for input(s): CHOL, HDL, LDLCALC, TRIG, CHOLHDL, LDLDIRECT in the last 72 hours. No results for input(s): TSH, T4TOTAL, T3FREE, THYROIDAB in the last 72 hours.  Invalid input(s): FREET3 No results for input(s): VITAMINB12, FOLATE, FERRITIN, TIBC, IRON, RETICCTPCT in the last 72 hours. No results for input(s): LIPASE, AMYLASE in the last 72 hours.  Urine Studies No results for input(s): UHGB, CRYS in  the last 72 hours.  Invalid input(s): UACOL, UAPR, USPG, UPH, UTP, UGL, UKET, UBIL, UNIT, UROB, ULEU, UEPI, UWBC, URBC, UBAC, CAST, UCOM, BILUA  MICROBIOLOGY: Recent Results (from the past 240 hour(s))  Culture, blood (routine x 2)     Status: None (Preliminary result)   Collection Time: 06/14/14  7:18 PM  Result Value Ref Range Status   Specimen Description BLOOD LEFT HAND  Final   Special Requests BOTTLES DRAWN AEROBIC ONLY 3CC  Final   Culture   Final           BLOOD CULTURE RECEIVED NO GROWTH TO DATE CULTURE WILL BE HELD FOR 5 DAYS  BEFORE ISSUING A FINAL NEGATIVE REPORT Note: Culture results may be compromised due to an inadequate volume of blood received in culture bottles. Performed at [facility]    Report Status PENDING  Incomplete  Culture, blood (routine x 2)     Status: None (Preliminary result)   Collection Time: 06/14/14  7:23 PM  Result Value Ref Range Status   Specimen Description BLOOD RIGHT HAND  Final   Special Requests BOTTLES DRAWN AEROBIC AND ANAEROBIC 5CC  Final   Culture   Final           BLOOD CULTURE RECEIVED NO GROWTH TO DATE CULTURE WILL BE HELD FOR 5 DAYS BEFORE ISSUING A FINAL NEGATIVE REPORT Performed at [facility]    Report Status PENDING  Incomplete  MRSA PCR Screening     Status: None   Collection Time: 06/14/14  9:38 PM  Result Value Ref Range Status   MRSA by PCR NEGATIVE NEGATIVE Final    Comment:        The GeneXpert MRSA Assay (FDA approved for NASAL specimens only), is one component of a comprehensive MRSA colonization surveillance program. It is not intended to diagnose MRSA infection nor to guide or monitor treatment for MRSA infections.     RADIOLOGY STUDIES/RESULTS: Dg Chest Port 1 View  06/14/2014   CLINICAL DATA:  Tachycardia. Cough, 1 week duration. Recent cardiac dysrhythmia. Atrial fibrillation.  EXAM: PORTABLE CHEST - 1 VIEW  COMPARISON:  06/01/2014.  09/30/2013  FINDINGS: Heart size is normal.  Mediastinal shadows are normal. There is patchy density in the left lower lobe the could be atelectasis or pneumonia. The right lung is clear. No evidence of edema or effusions.  IMPRESSION: Patchy density in the left lower lobe that could be atelectasis or pneumonia.   Electronically Signed   By: Nelson Chimes M.D.   On: 06/14/2014 17:51    Oren Binet, MD  [facility] Pager:[phone]  If 7PM-7AM, please contact night-coverage [email or web address] Password [email or web address] 06/16/2014, 12:26 PM   LOS: 2 days

## 2014-06-16 NOTE — Care Management Note (Addendum)
Case Management Note  Patient Details  Name: [name] MRN: [id number] Date of Birth: [birth year]  Subjective/Objective:   Pt admitted for generalized weakness and cough. Treating for PNA. Initiated on Cardizem gtt and changed to po Cardizem. Flecainide d/c and per MD notes considering po amio.                 Action/Plan: CM will continue to monitor for disposition needs.    Expected Discharge Date:                  Expected Discharge Plan:  [name]  In-House Referral:     Discharge planning Services  CM Consult  Post Acute Care Choice:    Choice offered to:     DME Arranged:    DME Agency:     HH Arranged:    [place] Agency:     Status of Service:     Medicare Important Message Given:    Date Medicare IM Given:    Medicare IM give by:    Date Additional Medicare IM Given:    Additional Medicare Important Message give by:     If discussed at [place] of Stay Meetings, dates discussed:    Additional Comments: 06-18-14 MD wanted PT to evaluate with pt again for disposition needs. CM will continue to monitor.   [name], RN 06/16/2014, 3:01 PM

## 2014-06-16 NOTE — Progress Notes (Signed)
SUBJECTIVE:  Feels better. Still with trouble swallowing.  OBJECTIVE:   Vitals:   Filed Vitals:   06/15/14 1200 06/15/14 1445 06/15/14 2100 06/16/14 0500  BP:  140/69 149/65 106/53  Pulse: 95 73 93 80  Temp:  98.5 F (36.9 C) 98.2 F (36.8 C) 98.1 F (36.7 C)  TempSrc:  Oral    Resp: 24 16 20 21   Height:      Weight:      SpO2: 96% 94% 96% 91%   I&O's:   Intake/Output Summary (Last 24 hours) at 06/16/14 1915 Last data filed at 06/16/14 1757  Gross per 24 hour  Intake    480 ml  Output    200 ml  Net    280 ml   TELEMETRY: Reviewed telemetry pt in atrial fibrillation with rapid ventricular response:     PHYSICAL EXAM General: Well developed, frail, in no acute distress Head:   Normal cephalic and atramatic  Lungs:  No wheezing Heart:  Tachycardic, irregular S1 S2  No JVD.   Abdomen: abdomen soft and non-tender Msk:  Back normal,  Normal strength and tone for age. Extremities:  No edema.   Neuro: Alert and oriented. Psych:  Normal affect, responds appropriately Skin: No rash   LABS: Basic Metabolic Panel:  Recent Labs  06/15/14 0831 06/16/14 0755  NA 137 140  K 3.7 4.5  CL 102 102  CO2 28 32  GLUCOSE 107* 89  BUN 7 12  CREATININE 0.54 0.54  CALCIUM 8.5* 8.6*  MG 1.8  --    Liver Function Tests:  Recent Labs  06/15/14 0831  AST 19  ALT 18  ALKPHOS 65  BILITOT 0.7  PROT 5.0*  ALBUMIN 2.8*   No results for input(s): LIPASE, AMYLASE in the last 72 hours. CBC:  Recent Labs  06/14/14 1637 06/15/14 0831 06/16/14 0755  WBC 11.4* 9.3 13.0*  NEUTROABS 9.7*  --   --   HGB 15.0 14.1 13.2  HCT 45.2 43.3 42.3  MCV 89.3 92.1 92.6  PLT 411* 343 317   Cardiac Enzymes:  Recent Labs  06/14/14 1637  TROPONINI <0.03   BNP: Invalid input(s): POCBNP D-Dimer: No results for input(s): DDIMER in the last 72 hours. Hemoglobin A1C: No results for input(s): HGBA1C in the last 72 hours. Fasting Lipid Panel: No results for input(s): CHOL, HDL,  LDLCALC, TRIG, CHOLHDL, LDLDIRECT in the last 72 hours. Thyroid Function Tests: No results for input(s): TSH, T4TOTAL, T3FREE, THYROIDAB in the last 72 hours.  Invalid input(s): FREET3 Anemia Panel: No results for input(s): VITAMINB12, FOLATE, FERRITIN, TIBC, IRON, RETICCTPCT in the last 72 hours. Coag Panel:   No results found for: INR, PROTIME  RADIOLOGY: Dg Chest Port 1 View  06/14/2014   CLINICAL DATA:  Tachycardia. Cough, 1 week duration. Recent cardiac dysrhythmia. Atrial fibrillation.  EXAM: PORTABLE CHEST - 1 VIEW  COMPARISON:  06/01/2014.  09/30/2013  FINDINGS: Heart size is normal. Mediastinal shadows are normal. There is patchy density in the left lower lobe the could be atelectasis or pneumonia. The right lung is clear. No evidence of edema or effusions.  IMPRESSION: Patchy density in the left lower lobe that could be atelectasis or pneumonia.   Electronically Signed   By: Nelson Chimes M.D.   On: 06/14/2014 17:51   Dg Swallowing Func-speech Pathology  06/16/2014    Objective Swallowing Evaluation:    Patient Details  Name: ILMA ACHEE MRN: [id number] Date of Birth: 06-Dec-1933  Today's Date: 06/16/2014  Time: SLP Start Time (ACUTE ONLY): 1330-SLP Stop Time (ACUTE ONLY): 1415 SLP Time Calculation (min) (ACUTE ONLY): 45 min  Past Medical History:  Past Medical History  Diagnosis Date  . Hypertension   . Dyslipidemia   . Palpitations   . Depression   . Hyperlipidemia   . GERD (gastroesophageal reflux disease)   . Urinary incontinence   . Hiatal hernia   . Renal cysts, acquired, bilateral   . Cholelithiasis   . Renal angiomyolipoma   . Hemorrhoids   . Tubular adenoma   . Heart murmur   . Abdominal pain   . Coronary artery disease   . PAF (paroxysmal atrial fibrillation)     a.  CHA2DS2VASc = 4-->Eliquis.   Past Surgical History:  Past Surgical History  Procedure Laterality Date  . Eye surgery    . Appendectomy  10/25/10  . Cholecystectomy  10/25/10   HPI:  Other Pertinent Information: 81 y.o.  female with year-long history of  dementia, poor PO intake for several months, generalized weakness,  suspicion of ALS (Scheduled for neurology appt at [facility] 06/25/14).  Patient was just at [facility] last week for treatment of A.Fib  RVR, cardioverted. Since discharge she has developed wet sounding cough,  congestion.  MD has concerns for aspiration given hx of dementia as well  as possible ALS.  Daughter reports intermittent cough when swallowing;  rapid cognitive decline since January.  Clinical swallow eval 5/10  completed with recs to proceed with MBS.    No Data Recorded  Assessment / Plan / Recommendation CHL IP CLINICAL IMPRESSIONS 06/16/2014  Therapy Diagnosis Mild pharyngeal phase dysphagia  Clinical Impression Pt presents with a mild pharyngeal dysphagia marked by  trace aspiration of thin liquids - pt with delayed initiation of swallow  response, mild pharyngeal residue post-swallow.  Postural adjustments did  not facilitate airway protection; aspiration consistently elicited a cough  response.  There was high penetration of nectar-thick liquids, but no  observed aspiration.  Etilogy uncertain - pt awaiting neurology f/u at   [facility] due to concerns for ALS.  Recommend a dysphagia 3 diet with  nectar-thick liquids for now; meds whole in puree; recommend OPSLP to  address dysphagia - to be scheduled after neurology appt 06/25/14. Educated  pt's spouse and dtrs at length re: recs and plan. Reviewed instructions  for thickening liquids.  Dtrs were present for MBS study.        CHL IP TREATMENT RECOMMENDATION 06/16/2014  Treatment Recommendations F/u OP SLP     CHL IP DIET RECOMMENDATION 06/16/2014  SLP Diet Recommendations Dysphagia 3 (Mech soft);Nectar  Liquid Administration via (None)  Medication Administration Whole meds with puree  Compensations Slow rate;Small sips/bites;Follow solids with liquid  Postural Changes and/or Swallow Maneuvers (None)     CHL IP OTHER RECOMMENDATIONS 06/16/2014   Recommended Consults (No Data)  Oral Care Recommendations Oral care BID  Other Recommendations Order thickener from pharmacy     No flowsheet data found.   CHL IP FREQUENCY AND DURATION 06/16/2014  Speech Therapy Frequency (ACUTE ONLY) (None)  Treatment Duration 1 week         SLP Swallow Goals No flowsheet data found.  No flowsheet data found.    CHL IP REASON FOR REFERRAL 06/16/2014  Reason for Referral Objectively evaluate swallowing function     CHL IP ORAL PHASE 06/16/2014  Lips (None)  Tongue (None)  Mucous membranes (None)  Nutritional status (None)  Other (None)  Oxygen therapy (None)  Oral Phase WFL  Oral - Pudding Teaspoon (None)  Oral - Pudding Cup (None)  Oral - Honey Teaspoon (None)  Oral - Honey Cup (None)  Oral - Honey Syringe (None)  Oral - Nectar Teaspoon (None)  Oral - Nectar Cup (None)  Oral - Nectar Straw (None)  Oral - Nectar Syringe (None)  Oral - Ice Chips (None)  Oral - Thin Teaspoon (None)  Oral - Thin Cup (None)  Oral - Thin Straw (None)  Oral - Thin Syringe (None)  Oral - Puree (None)  Oral - Mechanical Soft (None)  Oral - Regular (None)  Oral - Multi-consistency (None)  Oral - Pill (None)  Oral Phase - Comment (None)      CHL IP PHARYNGEAL PHASE 06/16/2014  Pharyngeal Phase Impaired  Pharyngeal - Pudding Teaspoon (None)  Penetration/Aspiration details (pudding teaspoon) (None)  Pharyngeal - Pudding Cup (None)  Penetration/Aspiration details (pudding cup) (None)  Pharyngeal - Honey Teaspoon (None)  Penetration/Aspiration details (honey teaspoon) (None)  Pharyngeal - Honey Cup (None)  Penetration/Aspiration details (honey cup) (None)  Pharyngeal - Honey Syringe (None)  Penetration/Aspiration details (honey syringe) (None)  Pharyngeal - Nectar Teaspoon (None)  Penetration/Aspiration details (nectar teaspoon) (None)  Pharyngeal - Nectar Cup (None)  Penetration/Aspiration details (nectar cup) (None)  Pharyngeal - Nectar Straw (None)  Penetration/Aspiration details (nectar straw) (None)  Pharyngeal  - Nectar Syringe (None)  Penetration/Aspiration details (nectar syringe) (None)  Pharyngeal - Ice Chips (None)  Penetration/Aspiration details (ice chips) (None)  Pharyngeal - Thin Teaspoon (None)  Penetration/Aspiration details (thin teaspoon) (None)  Pharyngeal - Thin Cup (None)  Penetration/Aspiration details (thin cup) (None)  Pharyngeal - Thin Straw (None)  Penetration/Aspiration details (thin straw) (None)  Pharyngeal - Thin Syringe (None)  Penetration/Aspiration details (thin syringe') (None)  Pharyngeal - Puree (None)  Penetration/Aspiration details (puree) (None)  Pharyngeal - Mechanical Soft (None)  Penetration/Aspiration details (mechanical soft) (None)  Pharyngeal - Regular (None)  Penetration/Aspiration details (regular) (None)  Pharyngeal - Multi-consistency (None)  Penetration/Aspiration details (multi-consistency) (None)  Pharyngeal - Pill (None)  Penetration/Aspiration details (pill) (None)  Pharyngeal Comment (None)      No flowsheet data found.  No flowsheet data found.         Juan Quam Laurice 06/16/2014, 2:41 PM       ASSESSMENT: Atrial fibrillation  PLAN:  She was not maintaining sinus rhythm on flecainide. This was stopped yesterday. Will increase diltiazem to 180 mg daily. If her heart rate is still not well controlled and her blood pressure is borderline, will start amiodarone tomorrow.  Of note, the patient saw Dr. Harrington Challenger in the past. She then started seeing a doctor at [facility]. She would like to change back to follow-up with Dr. Harrington Challenger.  Jettie Booze, MD  06/16/2014  7:15 PM

## 2014-06-16 NOTE — Discharge Instructions (Signed)
Information on my medicine - ELIQUIS (apixaban)  This medication education was reviewed with me or my healthcare representative as part of my discharge preparation.  The pharmacist that spoke with me during my hospital stay was:  Brain Hilts, [facility]  Why was Eliquis prescribed for you? Eliquis was prescribed for you to reduce the risk of a blood clot forming that can cause a stroke if you have a medical condition called atrial fibrillation (a type of irregular heartbeat).  What do You need to know about Eliquis ? Take your Eliquis TWICE DAILY - one tablet in the morning and one tablet in the evening with or without food. If you have difficulty swallowing the tablet whole please discuss with your pharmacist how to take the medication safely.  Take Eliquis exactly as prescribed by your doctor and DO NOT stop taking Eliquis without talking to the doctor who prescribed the medication.  Stopping may increase your risk of developing a stroke.  Refill your prescription before you run out.  After discharge, you should have regular check-up appointments with your healthcare provider that is prescribing your Eliquis.  In the future your dose may need to be changed if your kidney function or weight changes by a significant amount or as you get older.  What do you do if you miss a dose? If you miss a dose, take it as soon as you remember on the same day and resume taking twice daily.  Do not take more than one dose of ELIQUIS at the same time to make up a missed dose.  Important Safety Information A possible side effect of Eliquis is bleeding. You should call your healthcare provider right away if you experience any of the following: ? Bleeding from an injury or your nose that does not stop. ? Unusual colored urine (red or dark brown) or unusual colored stools (red or black). ? Unusual bruising for unknown reasons. ? A serious fall or if you hit your head (even if there is no  bleeding).  Some medicines may interact with Eliquis and might increase your risk of bleeding or clotting while on Eliquis. To help avoid this, consult your healthcare provider or pharmacist prior to using any new prescription or non-prescription medications, including herbals, vitamins, non-steroidal anti-inflammatory drugs (NSAIDs) and supplements.  This website has more information on Eliquis (apixaban): http://www.eliquis.com/eliquis/home  Signs and Symptoms of Stroke: F - Facial drooping A - Arm weakness S - Speech difficulties T - Time is brain, call 911 immediately if any of the above symptoms

## 2014-06-17 DIAGNOSIS — T17908A Unspecified foreign body in respiratory tract, part unspecified causing other injury, initial encounter: Secondary | ICD-10-CM | POA: Insufficient documentation

## 2014-06-17 DIAGNOSIS — T17908S Unspecified foreign body in respiratory tract, part unspecified causing other injury, sequela: Secondary | ICD-10-CM

## 2014-06-17 MED ORDER — AMOXICILLIN-POT CLAVULANATE 875-125 MG PO TABS
1.0000 | ORAL_TABLET | Freq: Two times a day (BID) | ORAL | Status: DC
  Administered 2014-06-17 – 2014-06-19 (×5): 1 via ORAL
  Filled 2014-06-17 (×5): qty 1

## 2014-06-17 NOTE — Progress Notes (Signed)
Medicare Important Message given? YES   (If response is "NO", the following Medicare IM given date fields will be blank)   Date Medicare IM given:   Medicare IM given by: Graves-Bigelow, Librada Castronovo  

## 2014-06-17 NOTE — Progress Notes (Signed)
Speech Language Pathology Treatment: Dysphagia  Patient Details Name: [name] MRN: [id number] DOB: 12-Aug-1933 Today's Date: 06/17/2014 Time: 0932-3557 SLP Time Calculation (min) (ACUTE ONLY): 14 min  Assessment / Plan / Recommendation Clinical Impression  Pt drinking nectar thick liquid in reclined position when SLP entered room. SLP reviewed MBS results and recommendations, and reviewed safe swallow precautions posted at [facility]. Family member and pt reported they were unaware of recommendation to sit upright during po intake. Encouraged pt to adhere to precautions, and pointed posted information out to pt/visitor. No difficulty with dys 3 consistencies observed or reported, however, pt indicated feeling nauseous. RN informed of pt nausea and pt/family's decreased awareness of safe swallow precautions. RN reported no difficulty with current diet or medications. ST to follow for diet tolerance, and to determine if/when repeat MBS is warranted to advance diet.    HPI Other Pertinent Information: 81 y.o. female with year-long history of dementia, poor PO intake for several months, generalized weakness, suspicion of ALS (Scheduled for neurology appt at [facility] 06/25/14). Patient was just at [facility] last week for treatment of A.Fib RVR, cardioverted. Since discharge she has developed wet sounding cough, congestion.  MD has concerns for aspiration given hx of dementia as well as possible ALS.  Daughter reports intermittent cough when swallowing; rapid cognitive decline since January.  MBS 06/16/14 with recs for dys 3 diet, nectar thick liquid. SLP to follow for diet tolerance and education.   Pertinent Vitals Pain Assessment: No/denies pain (pt reports nausea after meals. RN notified)  SLP Plan  Continue with current plan of care    Recommendations Diet recommendations: Dysphagia 3 (mechanical soft);Nectar-thick liquid Liquids provided via: No straw;Cup Medication  Administration: Whole meds with puree Supervision: Patient able to self feed;Full supervision/cueing for compensatory strategies Compensations: Slow rate;Small sips/bites;Follow solids with liquid Postural Changes and/or Swallow Maneuvers: Seated upright 90 degrees              Oral Care Recommendations: Oral care BID Follow up Recommendations: 24 hour supervision/assistance Plan: Continue with current plan of care    GO    Celia B. Quentin [facility], CCC-SLP [phone]   Shonna Chock 06/17/2014, 1:11 PM

## 2014-06-17 NOTE — Progress Notes (Signed)
PATIENT DETAILS Name: [name] Age: 81 y.o. Sex: female Date of Birth: April 03, 1933 Admit Date: 06/14/2014 Admitting Physician Etta Quill, DO YIF:OYDXA Harrington Challenger, MD  Subjective: Patient reporting nausea after lunch this afternoon, denies emesis. Shows a complains of ongoing generalized weakness. Currently denies chest pain or shortness of breath.  Assessment/Plan: Principal Problem:   HCAP (healthcare-associated pneumonia):  -Remains Afebrile, leukocytosis has resolved. Given history of possible ALS with dementia, some suspicion for aspiration. Last chest x-ray performed 06/14/2014 that showed patchy density in the left lower lobe that could be atelectasis versus pneumonia. -Patient on IV vancomycin and cefepime. Will discontinue IV antimicrobial therapy and transition to Augmentin. Follow-up on a.m. chest x-ray.  Active Problems:   Atrial fibrillation with RVR:  -Rate better required IV Cardizem infusion on admission-now on oral Cardizem -She is currently on Cardizem 180 mg by mouth daily, having been rate controlled over the past before hours.  -CHA2DS2VASc = 4. Anticoagulated with Eliquis. Cardiology following    Essential hypertension: Controlled with oral Cardizem. Follow and titrate accordingly.    Dementia with possible ALS: Defer further workup/evaluation to the outpatient setting-patient already has a previously scheduled appointment at [facility] on 5/19. Suspect worsening generalized weakness from acute illness/pneumonia    Generalized weakness: Physical therapy consulted.    Severe protein calorie malnutrition: will continue with supplements  Disposition: Remain inpatient-   Antimicrobial agents  See below  Anti-infectives    Start     Dose/Rate Route Frequency Ordered Stop   06/17/14 1300  amoxicillin-clavulanate (AUGMENTIN) 875-125 MG per tablet 1 tablet     1 tablet Oral 2 times daily 06/17/14 1253     06/15/14 1800  ceFEPIme (MAXIPIME)  1 g in dextrose 5 % 50 mL IVPB  Status:  Discontinued     1 g 100 mL/hr over 30 Minutes Intravenous Every 24 hours 06/14/14 1821 06/17/14 1253   06/15/14 0700  vancomycin (VANCOCIN) 500 mg in sodium chloride 0.9 % 100 mL IVPB  Status:  Discontinued     500 mg 100 mL/hr over 60 Minutes Intravenous Every 12 hours 06/14/14 1821 06/17/14 1253   06/14/14 1830  vancomycin (VANCOCIN) IVPB 1000 mg/200 mL premix     1,000 mg 200 mL/hr over 60 Minutes Intravenous NOW 06/14/14 1821 06/15/14 0212   06/14/14 1815  ceFEPIme (MAXIPIME) 1 g in dextrose 5 % 50 mL IVPB     1 g 100 mL/hr over 30 Minutes Intravenous  Once 06/14/14 1811 06/14/14 2011      DVT Prophylaxis: Eliquis  Family Communication Daughter at bedside  Procedures: None  CONSULTS:  cardiology  Time spent 25 minutes-Greater than 50% of this time was spent in counseling, explanation of diagnosis, planning of further management, and coordination of care.  MEDICATIONS: Scheduled Meds: . amoxicillin-clavulanate  1 tablet Oral BID  . apixaban  5 mg Oral BID  . cholecalciferol  2,000 Units Oral Daily  . dextromethorphan-guaiFENesin  1 tablet Oral BID  . diltiazem  180 mg Oral QHS  . feeding supplement (ENSURE ENLIVE)  237 mL Oral TID BM  . gabapentin  300 mg Oral TID  . metoCLOPramide  5 mg Oral TID AC & HS  . multivitamin with minerals  1 tablet Oral Daily  . traZODone  25 mg Oral QHS   Continuous Infusions:  PRN Meds:.acetaminophen, albuterol, ALPRAZolam, ondansetron (ZOFRAN) IV, polyethylene glycol, RESOURCE THICKENUP CLEAR    PHYSICAL  EXAM: Vital signs in last 24 hours: Filed Vitals:   06/16/14 1832 06/16/14 2100 06/17/14 0500 06/17/14 1700  BP: 127/67 136/70 119/51 131/59  Pulse: 97 92 92 107  Temp: 98.9 F (37.2 C) 98.3 F (36.8 C) 97.8 F (36.6 C) 98.6 F (37 C)  TempSrc: Oral Oral Oral Oral  Resp: 18 18 20 18   Height:      Weight:      SpO2: 97% 97% 96% 93%    Weight change:  Filed Weights   06/14/14  1637 06/14/14 2056  Weight: 53.524 kg (118 lb) 54.2 kg (119 lb 7.8 oz)   Body mass index is 20.5 kg/(m^2).   Gen Exam: Awake and alert with clear speech.  Chronically sick appearing. Weak voice-often quite a lot. Neck: Supple, No JVD.   Chest: B/L Clear.  Few bibasilar rales CVS: S1 S2 irregular, no murmurs.  Abdomen: soft, BS +, non tender, non distended.  Extremities: no edema, lower extremities warm to touch. Neurologic: Non Focal-blocker generalized weakness Skin: No Rash.   Wounds: N/A.   Intake/Output from previous day:  Intake/Output Summary (Last 24 hours) at 06/17/14 1751 Last data filed at 06/17/14 1715  Gross per 24 hour  Intake   1847 ml  Output      1 ml  Net   1846 ml     LAB RESULTS: CBC  Recent Labs Lab 06/14/14 1637 06/15/14 0831 06/16/14 0755  WBC 11.4* 9.3 13.0*  HGB 15.0 14.1 13.2  HCT 45.2 43.3 42.3  PLT 411* 343 317  MCV 89.3 92.1 92.6  MCH 29.6 30.0 28.9  MCHC 33.2 32.6 31.2  RDW 13.8 13.7 13.8  LYMPHSABS 1.0  --   --   MONOABS 0.7  --   --   EOSABS 0.0  --   --   BASOSABS 0.0  --   --     Chemistries   Recent Labs Lab 06/14/14 1637 06/15/14 0831 06/16/14 0755  NA 136 137 140  K 3.9 3.7 4.5  CL 101 102 102  CO2 26 28 32  GLUCOSE 146* 107* 89  BUN 16 7 12   CREATININE 0.58 0.54 0.54  CALCIUM 9.0 8.5* 8.6*  MG  --  1.8  --     CBG: No results for input(s): GLUCAP in the last 168 hours.  GFR Estimated Creatinine Clearance: 48.8 mL/min (by C-G formula based on Cr of 0.54).  Coagulation profile No results for input(s): INR, PROTIME in the last 168 hours.  Cardiac Enzymes  Recent Labs Lab 06/14/14 1637  TROPONINI <0.03    Invalid input(s): POCBNP No results for input(s): DDIMER in the last 72 hours. No results for input(s): HGBA1C in the last 72 hours. No results for input(s): CHOL, HDL, LDLCALC, TRIG, CHOLHDL, LDLDIRECT in the last 72 hours. No results for input(s): TSH, T4TOTAL, T3FREE, THYROIDAB in the last 72  hours.  Invalid input(s): FREET3 No results for input(s): VITAMINB12, FOLATE, FERRITIN, TIBC, IRON, RETICCTPCT in the last 72 hours. No results for input(s): LIPASE, AMYLASE in the last 72 hours.  Urine Studies No results for input(s): UHGB, CRYS in the last 72 hours.  Invalid input(s): UACOL, UAPR, USPG, UPH, UTP, UGL, UKET, UBIL, UNIT, UROB, ULEU, UEPI, UWBC, URBC, UBAC, CAST, UCOM, BILUA  MICROBIOLOGY: Recent Results (from the past 240 hour(s))  Culture, blood (routine x 2)     Status: None (Preliminary result)   Collection Time: 06/14/14  7:18 PM  Result Value Ref Range Status  Specimen Description BLOOD LEFT HAND  Final   Special Requests BOTTLES DRAWN AEROBIC ONLY 3CC  Final   Culture   Final           BLOOD CULTURE RECEIVED NO GROWTH TO DATE CULTURE WILL BE HELD FOR 5 DAYS BEFORE ISSUING A FINAL NEGATIVE REPORT Note: Culture results may be compromised due to an inadequate volume of blood received in culture bottles. Performed at [facility]    Report Status PENDING  Incomplete  Culture, blood (routine x 2)     Status: None (Preliminary result)   Collection Time: 06/14/14  7:23 PM  Result Value Ref Range Status   Specimen Description BLOOD RIGHT HAND  Final   Special Requests BOTTLES DRAWN AEROBIC AND ANAEROBIC 5CC  Final   Culture   Final           BLOOD CULTURE RECEIVED NO GROWTH TO DATE CULTURE WILL BE HELD FOR 5 DAYS BEFORE ISSUING A FINAL NEGATIVE REPORT Performed at [facility]    Report Status PENDING  Incomplete  MRSA PCR Screening     Status: None   Collection Time: 06/14/14  9:38 PM  Result Value Ref Range Status   MRSA by PCR NEGATIVE NEGATIVE Final    Comment:        The GeneXpert MRSA Assay (FDA approved for NASAL specimens only), is one component of a comprehensive MRSA colonization surveillance program. It is not intended to diagnose MRSA infection nor to guide or monitor treatment for MRSA infections.     RADIOLOGY  STUDIES/RESULTS: Dg Chest Port 1 View  06/14/2014   CLINICAL DATA:  Tachycardia. Cough, 1 week duration. Recent cardiac dysrhythmia. Atrial fibrillation.  EXAM: PORTABLE CHEST - 1 VIEW  COMPARISON:  06/01/2014.  09/30/2013  FINDINGS: Heart size is normal. Mediastinal shadows are normal. There is patchy density in the left lower lobe the could be atelectasis or pneumonia. The right lung is clear. No evidence of edema or effusions.  IMPRESSION: Patchy density in the left lower lobe that could be atelectasis or pneumonia.   Electronically Signed   By: Nelson Chimes M.D.   On: 06/14/2014 17:51   Dg Swallowing Func-speech Pathology  06/16/2014    Objective Swallowing Evaluation:    Patient Details  Name: ELLENI MOZINGO MRN: [id number] Date of Birth: 10/24/1933  Today's Date: 06/16/2014 Time: SLP Start Time (ACUTE ONLY): 1330-SLP Stop Time (ACUTE ONLY): 1415 SLP Time Calculation (min) (ACUTE ONLY): 45 min  Past Medical History:  Past Medical History  Diagnosis Date  . Hypertension   . Dyslipidemia   . Palpitations   . Depression   . Hyperlipidemia   . GERD (gastroesophageal reflux disease)   . Urinary incontinence   . Hiatal hernia   . Renal cysts, acquired, bilateral   . Cholelithiasis   . Renal angiomyolipoma   . Hemorrhoids   . Tubular adenoma   . Heart murmur   . Abdominal pain   . Coronary artery disease   . PAF (paroxysmal atrial fibrillation)     a.  CHA2DS2VASc = 4-->Eliquis.   Past Surgical History:  Past Surgical History  Procedure Laterality Date  . Eye surgery    . Appendectomy  10/25/10  . Cholecystectomy  10/25/10   HPI:  Other Pertinent Information: 81 y.o. female with year-long history of  dementia, poor PO intake for several months, generalized weakness,  suspicion of ALS (Scheduled for neurology appt at [facility] 06/25/14).  Patient was just at [facility] last week for treatment of A.Fib  RVR, cardioverted. Since discharge she has developed wet sounding cough,  congestion.  MD has  concerns for aspiration given hx of dementia as well  as possible ALS.  Daughter reports intermittent cough when swallowing;  rapid cognitive decline since January.  Clinical swallow eval 5/10  completed with recs to proceed with MBS.    No Data Recorded  Assessment / Plan / Recommendation CHL IP CLINICAL IMPRESSIONS 06/16/2014  Therapy Diagnosis Mild pharyngeal phase dysphagia  Clinical Impression Pt presents with a mild pharyngeal dysphagia marked by  trace aspiration of thin liquids - pt with delayed initiation of swallow  response, mild pharyngeal residue post-swallow.  Postural adjustments did  not facilitate airway protection; aspiration consistently elicited a cough  response.  There was high penetration of nectar-thick liquids, but no  observed aspiration.  Etilogy uncertain - pt awaiting neurology f/u at  [facility] due to concerns for ALS.  Recommend a dysphagia 3 diet with  nectar-thick liquids for now; meds whole in puree; recommend OPSLP to  address dysphagia - to be scheduled after neurology appt 06/25/14. Educated  pt's spouse and dtrs at length re: recs and plan. Reviewed instructions  for thickening liquids.  Dtrs were present for MBS study.        CHL IP TREATMENT RECOMMENDATION 06/16/2014  Treatment Recommendations F/u OP SLP     CHL IP DIET RECOMMENDATION 06/16/2014  SLP Diet Recommendations Dysphagia 3 (Mech soft);Nectar  Liquid Administration via (None)  Medication Administration Whole meds with puree  Compensations Slow rate;Small sips/bites;Follow solids with liquid  Postural Changes and/or Swallow Maneuvers (None)     CHL IP OTHER RECOMMENDATIONS 06/16/2014  Recommended Consults (No Data)  Oral Care Recommendations Oral care BID  Other Recommendations Order thickener from pharmacy     No flowsheet data found.   CHL IP FREQUENCY AND DURATION 06/16/2014  Speech Therapy Frequency (ACUTE ONLY) (None)  Treatment Duration 1 week         SLP Swallow Goals No flowsheet data found.  No flowsheet data found.     CHL IP REASON FOR REFERRAL 06/16/2014  Reason for Referral Objectively evaluate swallowing function     CHL IP ORAL PHASE 06/16/2014  Lips (None)  Tongue (None)  Mucous membranes (None)  Nutritional status (None)  Other (None)  Oxygen therapy (None)  Oral Phase WFL  Oral - Pudding Teaspoon (None)  Oral - Pudding Cup (None)  Oral - Honey Teaspoon (None)  Oral - Honey Cup (None)  Oral - Honey Syringe (None)  Oral - Nectar Teaspoon (None)  Oral - Nectar Cup (None)  Oral - Nectar Straw (None)  Oral - Nectar Syringe (None)  Oral - Ice Chips (None)  Oral - Thin Teaspoon (None)  Oral - Thin Cup (None)  Oral - Thin Straw (None)  Oral - Thin Syringe (None)  Oral - Puree (None)  Oral - Mechanical Soft (None)  Oral - Regular (None)  Oral - Multi-consistency (None)  Oral - Pill (None)  Oral Phase - Comment (None)      CHL IP PHARYNGEAL PHASE 06/16/2014  Pharyngeal Phase Impaired  Pharyngeal - Pudding Teaspoon (None)  Penetration/Aspiration details (pudding teaspoon) (None)  Pharyngeal - Pudding Cup (None)  Penetration/Aspiration details (pudding cup) (None)  Pharyngeal - Honey Teaspoon (None)  Penetration/Aspiration details (honey teaspoon) (None)  Pharyngeal - Honey Cup (None)  Penetration/Aspiration details (honey cup) (None)  Pharyngeal - Honey Syringe (None)  Penetration/Aspiration details (honey syringe) (None)  Pharyngeal - Nectar Teaspoon (None)  Penetration/Aspiration details (nectar teaspoon) (None)  Pharyngeal - Nectar Cup (None)  Penetration/Aspiration details (nectar cup) (None)  Pharyngeal - Nectar Straw (None)  Penetration/Aspiration details (nectar straw) (None)  Pharyngeal - Nectar Syringe (None)  Penetration/Aspiration details (nectar syringe) (None)  Pharyngeal - Ice Chips (None)  Penetration/Aspiration details (ice chips) (None)  Pharyngeal - Thin Teaspoon (None)  Penetration/Aspiration details (thin teaspoon) (None)  Pharyngeal - Thin Cup (None)  Penetration/Aspiration details (thin cup) (None)  Pharyngeal -  Thin Straw (None)  Penetration/Aspiration details (thin straw) (None)  Pharyngeal - Thin Syringe (None)  Penetration/Aspiration details (thin syringe') (None)  Pharyngeal - Puree (None)  Penetration/Aspiration details (puree) (None)  Pharyngeal - Mechanical Soft (None)  Penetration/Aspiration details (mechanical soft) (None)  Pharyngeal - Regular (None)  Penetration/Aspiration details (regular) (None)  Pharyngeal - Multi-consistency (None)  Penetration/Aspiration details (multi-consistency) (None)  Pharyngeal - Pill (None)  Penetration/Aspiration details (pill) (None)  Pharyngeal Comment (None)      No flowsheet data found.  No flowsheet data found.         Juan Quam Laurice 06/16/2014, 2:41 PM     Kelvin Cellar, MD  [facility] Pager:[phone]  If 7PM-7AM, please contact night-coverage [email or web address] Password [email or web address] 06/17/2014, 5:51 PM   LOS: 3 days

## 2014-06-17 NOTE — Progress Notes (Signed)
Patient Name: [name] Date of Encounter: 06/17/2014  Principal Problem:   HCAP (healthcare-associated pneumonia) Active Problems:   Essential hypertension   Dyslipidemia   Atrial fibrillation with RVR   Dementia   Generalized weakness   Protein-calorie malnutrition, severe    SUBJECTIVE  Complains of SOB.   CURRENT MEDS . apixaban  5 mg Oral BID  . ceFEPime (MAXIPIME) IV  1 g Intravenous Q24H  . cholecalciferol  2,000 Units Oral Daily  . dextromethorphan-guaiFENesin  1 tablet Oral BID  . diltiazem  180 mg Oral QHS  . feeding supplement (ENSURE ENLIVE)  237 mL Oral TID BM  . gabapentin  300 mg Oral TID  . metoCLOPramide  5 mg Oral TID AC & HS  . multivitamin with minerals  1 tablet Oral Daily  . traZODone  25 mg Oral QHS  . vancomycin  500 mg Intravenous Q12H    OBJECTIVE  Filed Vitals:   06/16/14 0500 06/16/14 1832 06/16/14 2100 06/17/14 0500  BP: 106/53 127/67 136/70 119/51  Pulse: 80 97 92 92  Temp: 98.1 F (36.7 C) 98.9 F (37.2 C) 98.3 F (36.8 C) 97.8 F (36.6 C)  TempSrc:  Oral Oral Oral  Resp: 21 18 18 20   Height:      Weight:      SpO2: 91% 97% 97% 96%    Intake/Output Summary (Last 24 hours) at 06/17/14 0942 Last data filed at 06/17/14 0802  Gross per 24 hour  Intake   1127 ml  Output      1 ml  Net   1126 ml   Filed Weights   06/14/14 1637 06/14/14 2056  Weight: 118 lb (53.524 kg) 119 lb 7.8 oz (54.2 kg)    PHYSICAL EXAM  General: Pleasant woman with O2 in place. She is trying to pull her oxygen out.  Neuro: Alert and oriented X 3. Moves all extremities spontaneously. Psych: Normal affect. HEENT:  Normal. Horseness.   Neck: Supple without bruits or JVD. Lungs:  Resp regular with O2 in place. Limited breathing effort.  Few bibasilar rales.  Heart: irregular with tachy. No murmur.  Abdomen: Soft, non-tender, non-distended, BS + x 4.  Extremities: No clubbing, cyanosis or edema. DP/PT/Radials 2+ and equal  bilaterally.  Accessory Clinical Findings  CBC  Recent Labs  06/14/14 1637 06/15/14 0831 06/16/14 0755  WBC 11.4* 9.3 13.0*  NEUTROABS 9.7*  --   --   HGB 15.0 14.1 13.2  HCT 45.2 43.3 42.3  MCV 89.3 92.1 92.6  PLT 411* 343 297   Basic Metabolic Panel  Recent Labs  06/15/14 0831 06/16/14 0755  NA 137 140  K 3.7 4.5  CL 102 102  CO2 28 32  GLUCOSE 107* 89  BUN 7 12  CREATININE 0.54 0.54  CALCIUM 8.5* 8.6*  MG 1.8  --    Liver Function Tests  Recent Labs  06/15/14 0831  AST 19  ALT 18  ALKPHOS 65  BILITOT 0.7  PROT 5.0*  ALBUMIN 2.8*   Cardiac Enzymes  Recent Labs  06/14/14 1637  TROPONINI <0.03     TELE  A.fib with HR in 80-90s. Occasional spike to 110s and PVCs.   Radiology/Studies   ASSESSMENT AND PLAN  1. Atrial fibrillation with RVR: IV Cardizem infusion on admission-now on oral Cardizem. Cardizem CD increased to 180mg  yesterday 5/10.  Flecainide discontinued yesterday, plans to start amiodarone if her heart rate still not well controlled. Tele showed rate in 80-90s with occasional  spike to 110s. Further titrate up Cardizem vs add amio. MD to advice. BP today 119/51. CHA2DS2VASc = 4. Anticoagulation with Eliquis. She would like to seen by Dr. Harrington Challenger.   2. Essential hypertension: Controlled with oral Cardizem.  Signed,  Bhagat,Bhavinkumar PA-C Pager [phone]    I have examined the patient and reviewed assessment and plan and discussed with patient.  Agree with above as stated.  Improved rate control with higher dose of cardizem.  Plan on continuing this dose.  BP stable.  Can increase cardizem if need arises.  For now, hold off on Amio.  Continue neuro w/u.   Cardiology f/u with Dr. Harrington Challenger.   [name] S.

## 2014-06-17 NOTE — Progress Notes (Signed)
ANTIBIOTIC CONSULT NOTE - FOLLOW UP  Pharmacy Consult for Vancomycin and Cefepime Indication: HCAP vs aspiration pneumonia  Allergies  Allergen Reactions  . Aspirin Anaphylaxis    Patient Measurements: Height: 5\' 4"  (162.6 cm) Weight: 119 lb 7.8 oz (54.2 kg) IBW/kg (Calculated) : 54.7  Vital Signs: Temp: 97.8 F (36.6 C) (05/11 0500) Temp Source: Oral (05/11 0500) BP: 119/51 mmHg (05/11 0500) Pulse Rate: 92 (05/11 0500) Intake/Output from previous day: 05/10 0701 - 05/11 0700 In: 767 [P.O.:717; IV Piggyback:50] Out: 1 [Urine:1] Intake/Output from this shift: Total I/O In: 480 [P.O.:480] Out: -   Labs:  Recent Labs  06/14/14 1637 06/15/14 0831 06/16/14 0755  WBC 11.4* 9.3 13.0*  HGB 15.0 14.1 13.2  PLT 411* 343 317  CREATININE 0.58 0.54 0.54   Estimated Creatinine Clearance: 48.8 mL/min (by C-G formula based on Cr of 0.54).  Assessment: 81yof continues on day #3 vancomycin and cefepime for HCAP versus aspiration pneumonia (MBS completed which showed aspiration of thin liquids). Renal function has remained stable. Cultures negative.  5/8 Vancomycin>> 5/8 Cefepime>>  5/8 BC x 2 > NGTD Strep UA - negative Legionella UA >> negative HIV - NR  Goal of Therapy:  Vancomycin trough level 15-20 mcg/ml  Plan:  1) Continue vancomycin 500mg  IV q12 2) Continue cefepime 1g IV q24 3) Continue to follow renal function, cultures, LOT, level if needed  Deboraha Sprang 06/17/2014,10:14 AM

## 2014-06-17 NOTE — Progress Notes (Signed)
Pt given a Xanax today r/t to the pt feeling very anxious. Will continue to monitor. Pt 02 Sats 92% will notify MD. Etta Quill, RN

## 2014-06-18 ENCOUNTER — Inpatient Hospital Stay (HOSPITAL_COMMUNITY): Payer: Medicare Other

## 2014-06-18 LAB — BASIC METABOLIC PANEL
Anion gap: 10 (ref 5–15)
BUN: 15 mg/dL (ref 6–20)
CALCIUM: 8.8 mg/dL — AB (ref 8.9–10.3)
CHLORIDE: 93 mmol/L — AB (ref 101–111)
CO2: 35 mmol/L — ABNORMAL HIGH (ref 22–32)
Creatinine, Ser: 0.64 mg/dL (ref 0.44–1.00)
GLUCOSE: 178 mg/dL — AB (ref 65–99)
POTASSIUM: 4.4 mmol/L (ref 3.5–5.1)
Sodium: 138 mmol/L (ref 135–145)

## 2014-06-18 LAB — CBC
HCT: 42.6 % (ref 36.0–46.0)
Hemoglobin: 13.3 g/dL (ref 12.0–15.0)
MCH: 29.2 pg (ref 26.0–34.0)
MCHC: 31.2 g/dL (ref 30.0–36.0)
MCV: 93.6 fL (ref 78.0–100.0)
Platelets: 328 10*3/uL (ref 150–400)
RBC: 4.55 MIL/uL (ref 3.87–5.11)
RDW: 13.8 % (ref 11.5–15.5)
WBC: 13.7 10*3/uL — ABNORMAL HIGH (ref 4.0–10.5)

## 2014-06-18 MED ORDER — TRAZODONE HCL 50 MG PO TABS
25.0000 mg | ORAL_TABLET | Freq: Every evening | ORAL | Status: DC | PRN
  Administered 2014-06-18: 25 mg via ORAL
  Filled 2014-06-18: qty 1

## 2014-06-18 MED ORDER — LORAZEPAM 0.5 MG PO TABS: 0.5000 mg | ORAL_TABLET | Freq: Two times a day (BID) | ORAL | Status: DC | PRN

## 2014-06-18 NOTE — Progress Notes (Signed)
While patient sleeping oxygen in the upper 80's via pulse oximetry.  RN placed 2L nasal cannula on patient and oxygen in the upper 90's.  Will continue to monitor.

## 2014-06-18 NOTE — Progress Notes (Addendum)
Patient Name: [name] Date of Encounter: 06/18/2014  Principal Problem:   HCAP (healthcare-associated pneumonia) Active Problems:   Essential hypertension   Dyslipidemia   Atrial fibrillation with RVR   Dementia   Generalized weakness   Protein-calorie malnutrition, severe    SUBJECTIVE  Complains of SOB.   CURRENT MEDS . amoxicillin-clavulanate  1 tablet Oral BID  . apixaban  5 mg Oral BID  . cholecalciferol  2,000 Units Oral Daily  . dextromethorphan-guaiFENesin  1 tablet Oral BID  . diltiazem  180 mg Oral QHS  . feeding supplement (ENSURE ENLIVE)  237 mL Oral TID BM  . gabapentin  300 mg Oral TID  . metoCLOPramide  5 mg Oral TID AC & HS  . multivitamin with minerals  1 tablet Oral Daily  . traZODone  25 mg Oral QHS    OBJECTIVE  Filed Vitals:   06/18/14 0025 06/18/14 0130 06/18/14 0250 06/18/14 0546  BP:    128/56  Pulse:    91  Temp:    98.2 F (36.8 C)  TempSrc:    Axillary  Resp:      Height:      Weight:    126 lb 8 oz (57.38 kg)  SpO2: 96% 97% 96% 95%    Intake/Output Summary (Last 24 hours) at 06/18/14 0938 Last data filed at 06/18/14 0825  Gross per 24 hour  Intake   1314 ml  Output      0 ml  Net   1314 ml   Filed Weights   06/14/14 1637 06/14/14 2056 06/18/14 0546  Weight: 118 lb (53.524 kg) 119 lb 7.8 oz (54.2 kg) 126 lb 8 oz (57.38 kg)    PHYSICAL EXAM  General: Pleasant woman with O2 in place.  Neuro: Alert and oriented X 3. Moves all extremities spontaneously. Psych: Normal affect. HEENT:  Normal. Horseness.   Neck: Supple without bruits or JVD. Lungs:  Resp regular with O2 in place. Limited breathing effort.  Few bibasilar rales.  Heart: irregular with tachy. No murmur.  Abdomen: Soft, non-tender, non-distended, BS + x 4.  Extremities: No clubbing, cyanosis or edema. DP/PT/Radials 2+ and equal bilaterally.  Accessory Clinical Findings  CBC  Recent Labs  06/16/14 0755  WBC 13.0*  HGB 13.2  HCT 42.3  MCV  92.6  PLT 102   Basic Metabolic Panel  Recent Labs  06/16/14 0755  NA 140  K 4.5  CL 102  CO2 32  GLUCOSE 89  BUN 12  CREATININE 0.54  CALCIUM 8.6*    TELE  A.fib with HR in 80-90s up until ~7AM today. Spike to 140s.   Radiology/Studies   ASSESSMENT AND PLAN  1. Atrial fibrillation with RVR: IV Cardizem infusion on admission-now on oral Cardizem. Cardizem CD increased to 180mg . Flecainide discontinued 5/10. Tele showed rate in 80-90s up until this morning. Her rate jumped above 100s when she went for X-ary and walked in hallway. Spike to 140s. Rate now slowly coming down. Will watch for now, if still high add amio. BP is stable. CHA2DS2VASc = 4. Continue anticoagulation with Eliquis. Cardiology f/u with Dr. Harrington Challenger.   2. Essential hypertension: Controlled with oral Cardizem.  Signed,   Bhagat,Bhavinkumar PA-C Pager [phone]    I have examined the patient and reviewed assessment and plan and discussed with patient.  Agree with above as stated.  Adequate rate control. No signs of heart failure. With activity, her heart rate will go up. I'm hesitant to add another  medicine like amiodarone. If her blood pressure remains above 390 systolic, could consider increasing diltiazem to 240 mg daily. No significant change off of flecainide. She is not maintaining sinus rhythm and at this point, I don't think we have to pursue sinus rhythm, unless she has significant palpitations.    Possible atelectasis noted on today's chest x-ray. Will order incentive spirometer.  Chrystian Cupples S.

## 2014-06-18 NOTE — Progress Notes (Signed)
[facility]                                                                              Patient Demographics  [name], is a 81 y.o. female, DOB - 1933/02/13, ZOX:[id number]  Admit date - 06/14/2014   Admitting Physician Etta Quill, DO  Outpatient Primary MD for the patient is Dorris Carnes, MD  LOS - 4   Chief Complaint  Patient presents with  . Tachycardia       Brief HPI   [name] is a 81 y.o. female with year long history of dementia, poor PO intake for several months, generalized weakness, suspicion of ALS. Patient was just at [facility] a week before the admission for treatment of A.Fib RVR and was cardioverted. Since discharge, she has developed wet sounding cough, congestion. No fever nor chills. Has been put on keflex as outpatient. Patient was admitted for further workup.    Assessment & Plan    Principal Problem:   HCAP (healthcare-associated pneumonia) - Afebrile, follow CBC, has aspiration - Patient was placed on IV vancomycin and cefepime, transitioned to Augmentin - Speech therapy evaluated the patient and recommended dysphagia 3 diet with nectar thick liquid  Active Problems:  Atrial fibrillation with RVR:  - Rate better required IV Cardizem infusion on admission-now on oral Cardizem, however rate in low 100s this morning -Currently on Cardizem 180 mg daily -CHA2DS2VASc = 4. Anticoagulated with Eliquis. Cardiology following   Essential hypertension:  Controlled with oral Cardizem.    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 Suspect worsening generalized weakness from acute illness/pneumonia   Generalized weakness: Repeat PT evaluation    Severe protein calorie malnutrition:  - will continue with supplements  Lethargy - Patient received Xanax, will discontinue Xanax, she does  not take it at home. Continue trazodone for sleep as needed, Ativan as needed for acute anxiety. Patient is too lethargic now with a longer acting benzodiazepine.    Code Status: Full code  Family Communication: Discussed in detail with the patient, all imaging results, lab results explained to the patient and daughter at the bedside    Disposition Plan: Await repeat PT evaluation   Time Spent in minutes  25  minutes  Procedures  None  Consults   Cardiology   DVT Prophylaxis eliquis  Medications  Scheduled Meds: . amoxicillin-clavulanate  1 tablet Oral BID  . apixaban  5 mg Oral BID  . cholecalciferol  2,000 Units Oral Daily  . dextromethorphan-guaiFENesin  1 tablet Oral BID  . diltiazem  180 mg Oral QHS  . feeding supplement (ENSURE ENLIVE)  237 mL Oral TID BM  . gabapentin  300 mg Oral TID  . metoCLOPramide  5 mg Oral TID AC & HS  . multivitamin with minerals  1 tablet Oral Daily  . traZODone  25 mg Oral QHS   Continuous Infusions:  PRN Meds:.acetaminophen, albuterol, LORazepam, ondansetron (ZOFRAN) IV, polyethylene glycol, RESOURCE THICKENUP CLEAR   Antibiotics   Anti-infectives    Start     Dose/Rate  Route Frequency Ordered Stop   06/17/14 1300  amoxicillin-clavulanate (AUGMENTIN) 875-125 MG per tablet 1 tablet     1 tablet Oral 2 times daily 06/17/14 1253     06/15/14 1800  ceFEPIme (MAXIPIME) 1 g in dextrose 5 % 50 mL IVPB  Status:  Discontinued     1 g 100 mL/hr over 30 Minutes Intravenous Every 24 hours 06/14/14 1821 06/17/14 1253   06/15/14 0700  vancomycin (VANCOCIN) 500 mg in sodium chloride 0.9 % 100 mL IVPB  Status:  Discontinued     500 mg 100 mL/hr over 60 Minutes Intravenous Every 12 hours 06/14/14 1821 06/17/14 1253   06/14/14 1830  vancomycin (VANCOCIN) IVPB 1000 mg/200 mL premix     1,000 mg 200 mL/hr over 60 Minutes Intravenous NOW 06/14/14 1821 06/15/14 0212   06/14/14 1815  ceFEPIme (MAXIPIME) 1 g in dextrose 5 % 50 mL IVPB     1 g 100  mL/hr over 30 Minutes Intravenous  Once 06/14/14 1811 06/14/14 2011        Subjective:   [name] was seen and examined today. Somewhat somnolent, received Xanax yesterday at 1355 and 2209 and trazodone to sleep. Patient denies dizziness, chest pain, shortness of breath, abdominal pain, N/V/D/C, new weakness, numbess, tingling. No acute events overnight.    Objective:   Blood pressure 128/56, pulse 91, temperature 98.2 F (36.8 C), temperature source Axillary, resp. rate 18, height 5\' 4"  (1.626 m), weight 57.38 kg (126 lb 8 oz), SpO2 95 %.  Wt Readings from Last 3 Encounters:  06/18/14 57.38 kg (126 lb 8 oz)  10/07/13 57.7 kg (127 lb 3.3 oz)  09/18/13 58.514 kg (129 lb)     Intake/Output Summary (Last 24 hours) at 06/18/14 1123 Last data filed at 06/18/14 0825  Gross per 24 hour  Intake   1314 ml  Output      0 ml  Net   1314 ml    Exam  General:  somnolent but oriented, able to respond to questions, NAD  HEENT:  PERRLA, EOMI, Anicteic Sclera, mucous membranes moist.   Neck: Supple, no JVD, no masses  CVS: S1 S2 auscultated, no rubs, murmurs or gallops. Regular rate and rhythm.  Respiratory:Decreased breath sounds at the bases   Abdomen: Soft, nontender, nondistended, + bowel sounds  Ext: no cyanosis clubbing or edema  Neuro:somnolent but arousable and oriented  Cr N's II- XII. Strength 5/5 upper and lower extremities bilaterally  Skin: No rashes  Psych: Normal affect and demeanor, oriented x3    Data Review   Micro Results Recent Results (from the past 240 hour(s))  Culture, blood (routine x 2)     Status: None (Preliminary result)   Collection Time: 06/14/14  7:18 PM  Result Value Ref Range Status   Specimen Description BLOOD LEFT HAND  Final   Special Requests BOTTLES DRAWN AEROBIC ONLY 3CC  Final   Culture   Final           BLOOD CULTURE RECEIVED NO GROWTH TO DATE CULTURE WILL BE HELD FOR 5 DAYS BEFORE ISSUING A FINAL NEGATIVE REPORT Note:  Culture results may be compromised due to an inadequate volume of blood received in culture bottles. Performed at [facility]    Report Status PENDING  Incomplete  Culture, blood (routine x 2)     Status: None (Preliminary result)   Collection Time: 06/14/14  7:23 PM  Result Value Ref Range Status   Specimen Description BLOOD RIGHT HAND  Final   Special Requests BOTTLES DRAWN AEROBIC AND ANAEROBIC 5CC  Final   Culture   Final           BLOOD CULTURE RECEIVED NO GROWTH TO DATE CULTURE WILL BE HELD FOR 5 DAYS BEFORE ISSUING A FINAL NEGATIVE REPORT Performed at [facility]    Report Status PENDING  Incomplete  MRSA PCR Screening     Status: None   Collection Time: 06/14/14  9:38 PM  Result Value Ref Range Status   MRSA by PCR NEGATIVE NEGATIVE Final    Comment:        The GeneXpert MRSA Assay (FDA approved for NASAL specimens only), is one component of a comprehensive MRSA colonization surveillance program. It is not intended to diagnose MRSA infection nor to guide or monitor treatment for MRSA infections.     Radiology Reports Dg Chest 2 View  06/18/2014   CLINICAL DATA:  Aspiration into respiratory tract. Healthcare associated pneumonia. Atrial fibrillation.  EXAM: CHEST  2 VIEW  COMPARISON:  06/14/2014  FINDINGS: Left lower lobe volume loss and infiltrate shows no significant interval change. There is also mild infiltrate or atelectasis in the right lower lung, which is increased since previous study. Heart size remains within normal limits. Probable tiny left posterior pleural effusion noted.  IMPRESSION: No significant change in left lower lobe volume loss and infiltrate. Probable tiny left pleural effusion.  Increased mild infiltrate versus atelectasis in right lower lung.a   Electronically Signed   By: Earle Gell M.D.   On: 06/18/2014 08:19   Dg Chest Port 1 View  06/14/2014   CLINICAL DATA:  Tachycardia. Cough, 1 week duration. Recent cardiac dysrhythmia.  Atrial fibrillation.  EXAM: PORTABLE CHEST - 1 VIEW  COMPARISON:  06/01/2014.  09/30/2013  FINDINGS: Heart size is normal. Mediastinal shadows are normal. There is patchy density in the left lower lobe the could be atelectasis or pneumonia. The right lung is clear. No evidence of edema or effusions.  IMPRESSION: Patchy density in the left lower lobe that could be atelectasis or pneumonia.   Electronically Signed   By: Nelson Chimes M.D.   On: 06/14/2014 17:51   Dg Swallowing Func-speech Pathology  06/16/2014    Objective Swallowing Evaluation:    Patient Details  Name: [name] MRN: [id number] Date of Birth: 11-10-1933  Today's Date: 06/16/2014 Time: SLP Start Time (ACUTE ONLY): 1330-SLP Stop Time (ACUTE ONLY): 1415 SLP Time Calculation (min) (ACUTE ONLY): 45 min  Past Medical History:  Past Medical History  Diagnosis Date  . Hypertension   . Dyslipidemia   . Palpitations   . Depression   . Hyperlipidemia   . GERD (gastroesophageal reflux disease)   . Urinary incontinence   . Hiatal hernia   . Renal cysts, acquired, bilateral   . Cholelithiasis   . Renal angiomyolipoma   . Hemorrhoids   . Tubular adenoma   . Heart murmur   . Abdominal pain   . Coronary artery disease   . PAF (paroxysmal atrial fibrillation)     a.  CHA2DS2VASc = 4-->Eliquis.   Past Surgical History:  Past Surgical History  Procedure Laterality Date  . Eye surgery    . Appendectomy  10/25/10  . Cholecystectomy  10/25/10   HPI:  Other Pertinent Information: 81 y.o. female with year-long history of  dementia, poor PO intake for several months, generalized weakness,  suspicion of ALS (Scheduled for neurology appt at [facility] 06/25/14).  Patient was just at  [facility] for treatment of A.Fib  RVR, cardioverted. Since discharge she has developed wet sounding cough,  congestion.  MD has concerns for aspiration given hx of dementia as well  as possible ALS.  Daughter reports intermittent cough when swallowing;  rapid cognitive  decline since January.  Clinical swallow eval 5/10  completed with recs to proceed with MBS.    No Data Recorded  Assessment / Plan / Recommendation CHL IP CLINICAL IMPRESSIONS 06/16/2014  Therapy Diagnosis Mild pharyngeal phase dysphagia  Clinical Impression Pt presents with a mild pharyngeal dysphagia marked by  trace aspiration of thin liquids - pt with delayed initiation of swallow  response, mild pharyngeal residue post-swallow.  Postural adjustments did  not facilitate airway protection; aspiration consistently elicited a cough  response.  There was high penetration of nectar-thick liquids, but no  observed aspiration.  Etilogy uncertain - pt awaiting neurology f/u at  [facility] due to concerns for ALS.  Recommend a dysphagia 3 diet with  nectar-thick liquids for now; meds whole in puree; recommend OPSLP to  address dysphagia - to be scheduled after neurology appt 06/25/14. Educated  pt's spouse and dtrs at length re: recs and plan. Reviewed instructions  for thickening liquids.  Dtrs were present for MBS study.        CHL IP TREATMENT RECOMMENDATION 06/16/2014  Treatment Recommendations F/u OP SLP     CHL IP DIET RECOMMENDATION 06/16/2014  SLP Diet Recommendations Dysphagia 3 (Mech soft);Nectar  Liquid Administration via (None)  Medication Administration Whole meds with puree  Compensations Slow rate;Small sips/bites;Follow solids with liquid  Postural Changes and/or Swallow Maneuvers (None)     CHL IP OTHER RECOMMENDATIONS 06/16/2014  Recommended Consults (No Data)  Oral Care Recommendations Oral care BID  Other Recommendations Order thickener from pharmacy     No flowsheet data found.   CHL IP FREQUENCY AND DURATION 06/16/2014  Speech Therapy Frequency (ACUTE ONLY) (None)  Treatment Duration 1 week         SLP Swallow Goals No flowsheet data found.  No flowsheet data found.    CHL IP REASON FOR REFERRAL 06/16/2014  Reason for Referral Objectively evaluate swallowing function     CHL IP ORAL PHASE 06/16/2014  Lips  (None)  Tongue (None)  Mucous membranes (None)  Nutritional status (None)  Other (None)  Oxygen therapy (None)  Oral Phase WFL  Oral - Pudding Teaspoon (None)  Oral - Pudding Cup (None)  Oral - Honey Teaspoon (None)  Oral - Honey Cup (None)  Oral - Honey Syringe (None)  Oral - Nectar Teaspoon (None)  Oral - Nectar Cup (None)  Oral - Nectar Straw (None)  Oral - Nectar Syringe (None)  Oral - Ice Chips (None)  Oral - Thin Teaspoon (None)  Oral - Thin Cup (None)  Oral - Thin Straw (None)  Oral - Thin Syringe (None)  Oral - Puree (None)  Oral - Mechanical Soft (None)  Oral - Regular (None)  Oral - Multi-consistency (None)  Oral - Pill (None)  Oral Phase - Comment (None)      CHL IP PHARYNGEAL PHASE 06/16/2014  Pharyngeal Phase Impaired  Pharyngeal - Pudding Teaspoon (None)  Penetration/Aspiration details (pudding teaspoon) (None)  Pharyngeal - Pudding Cup (None)  Penetration/Aspiration details (pudding cup) (None)  Pharyngeal - Honey Teaspoon (None)  Penetration/Aspiration details (honey teaspoon) (None)  Pharyngeal - Honey Cup (None)  Penetration/Aspiration details (honey cup) (None)  Pharyngeal - Honey Syringe (None)  Penetration/Aspiration details (honey syringe) (None)  Pharyngeal - Nectar Teaspoon (None)  Penetration/Aspiration details (nectar teaspoon) (None)  Pharyngeal - Nectar Cup (None)  Penetration/Aspiration details (nectar cup) (None)  Pharyngeal - Nectar Straw (None)  Penetration/Aspiration details (nectar straw) (None)  Pharyngeal - Nectar Syringe (None)  Penetration/Aspiration details (nectar syringe) (None)  Pharyngeal - Ice Chips (None)  Penetration/Aspiration details (ice chips) (None)  Pharyngeal - Thin Teaspoon (None)  Penetration/Aspiration details (thin teaspoon) (None)  Pharyngeal - Thin Cup (None)  Penetration/Aspiration details (thin cup) (None)  Pharyngeal - Thin Straw (None)  Penetration/Aspiration details (thin straw) (None)  Pharyngeal - Thin Syringe (None)  Penetration/Aspiration details  (thin syringe') (None)  Pharyngeal - Puree (None)  Penetration/Aspiration details (puree) (None)  Pharyngeal - Mechanical Soft (None)  Penetration/Aspiration details (mechanical soft) (None)  Pharyngeal - Regular (None)  Penetration/Aspiration details (regular) (None)  Pharyngeal - Multi-consistency (None)  Penetration/Aspiration details (multi-consistency) (None)  Pharyngeal - Pill (None)  Penetration/Aspiration details (pill) (None)  Pharyngeal Comment (None)      No flowsheet data found.  No flowsheet data found.         Juan Quam Laurice 06/16/2014, 2:41 PM     CBC  Recent Labs Lab 06/14/14 1637 06/15/14 0831 06/16/14 0755 06/18/14 1000  WBC 11.4* 9.3 13.0* 13.7*  HGB 15.0 14.1 13.2 13.3  HCT 45.2 43.3 42.3 42.6  PLT 411* 343 317 328  MCV 89.3 92.1 92.6 93.6  MCH 29.6 30.0 28.9 29.2  MCHC 33.2 32.6 31.2 31.2  RDW 13.8 13.7 13.8 13.8  LYMPHSABS 1.0  --   --   --   MONOABS 0.7  --   --   --   EOSABS 0.0  --   --   --   BASOSABS 0.0  --   --   --     Chemistries   Recent Labs Lab 06/14/14 1637 06/15/14 0831 06/16/14 0755  NA 136 137 140  K 3.9 3.7 4.5  CL 101 102 102  CO2 26 28 32  GLUCOSE 146* 107* 89  BUN 16 7 12   CREATININE 0.58 0.54 0.54  CALCIUM 9.0 8.5* 8.6*  MG  --  1.8  --   AST  --  19  --   ALT  --  18  --   ALKPHOS  --  65  --   BILITOT  --  0.7  --    ------------------------------------------------------------------------------------------------------------------ estimated creatinine clearance is 49.2 mL/min (by C-G formula based on Cr of 0.54). ------------------------------------------------------------------------------------------------------------------ No results for input(s): HGBA1C in the last 72 hours. ------------------------------------------------------------------------------------------------------------------ No results for input(s): CHOL, HDL, LDLCALC, TRIG, CHOLHDL, LDLDIRECT in the last 72  hours. ------------------------------------------------------------------------------------------------------------------ No results for input(s): TSH, T4TOTAL, T3FREE, THYROIDAB in the last 72 hours.  Invalid input(s): FREET3 ------------------------------------------------------------------------------------------------------------------ No results for input(s): VITAMINB12, FOLATE, FERRITIN, TIBC, IRON, RETICCTPCT in the last 72 hours.  Coagulation profile No results for input(s): INR, PROTIME in the last 168 hours.  No results for input(s): DDIMER in the last 72 hours.  Cardiac Enzymes  Recent Labs Lab 06/14/14 1637  TROPONINI <0.03   ------------------------------------------------------------------------------------------------------------------ Invalid input(s): POCBNP  No results for input(s): GLUCAP in the last 72 hours.   RAI,RIPUDEEP M.D. [facility] 06/18/2014, 11:23 AM  Pager: [phone]   Between 7am to 7pm - call Pager - [phone]  After 7pm go to [email or web address] - password [email or web address]  Call night coverage person covering after 7pm

## 2014-06-18 NOTE — Progress Notes (Signed)
Physical Therapy Treatment Patient Details Name: [name] MRN: [id number] DOB: [birth year] Today's Date: 14-Jul-2014    History of Present Illness Pt adm with PNA and a-fib with RVR. Pt also with dementia and in process of work up for ALS.    PT Comments    Patient with fluctuating level of arousal today.  Family reports sleeping a lot.  Feel may be helpful to have HHPT for carryover with family education on assisting pt with mobility due to likely not safe with device at this point.  Follow Up Recommendations  Home health PT     Equipment Recommendations  None recommended by PT    Recommendations for Other Services       Precautions / Restrictions Precautions Precautions: Fall Precaution Comments: watch HR    Mobility  Bed Mobility Overal bed mobility: Needs Assistance       Supine to sit: Min assist     General bed mobility comments: assist to lift trunk upright  Transfers Overall transfer level: Needs assistance   Transfers: Sit to/from Stand Sit to Stand: Min guard         General transfer comment: assist for balance  Ambulation/Gait Ambulation/Gait assistance: Min assist Ambulation Distance (Feet): 300 Feet Assistive device: 1 person hand held assist Gait Pattern/deviations: Step-through pattern;Decreased stride length     General Gait Details: one episode in room cross step for balance min assist recovery; pt weak and needs assist for safety; HR up to 148 max initially after walked to bathroom, returned to supine briefly and RN stated okay to walk, so HR up to 120's with ambulation second attempt    Stairs            Wheelchair Mobility    Modified Rankin (Stroke Patients Only)       Balance             Standing balance-Leahy Scale: Fair                      Cognition Arousal/Alertness: Lethargic Behavior During Therapy: Flat affect Overall Cognitive Status: History of cognitive impairments - at baseline                      Exercises General Exercises - Lower Extremity Ankle Circles/Pumps: AROM;Both;10 reps;Supine Heel Slides: AROM;Both;10 reps;Supine    General Comments        Pertinent Vitals/Pain Pain Assessment: No/denies pain    Home Living                      Prior Function            PT Goals (current goals can now be found in the care plan section) Progress towards PT goals: Progressing toward goals    Frequency  Min 3X/week    PT Plan Discharge plan needs to be updated    Co-evaluation             End of Session Equipment Utilized During Treatment: Gait belt Activity Tolerance: Other (comment) (initially with increased HR) Patient left: in bed     Time: 1325-1400 PT Time Calculation (min) (ACUTE ONLY): 35 min  Charges:  $Gait Training: 8-22 mins $Therapeutic Exercise: 8-22 mins                    G Codes:      Garrett Mitchum,CYNDI 2014/07/14, 5:57 PM  Magda Kiel, [place] 07/14/2014

## 2014-06-19 DIAGNOSIS — T17908D Unspecified foreign body in respiratory tract, part unspecified causing other injury, subsequent encounter: Secondary | ICD-10-CM

## 2014-06-19 MED ORDER — BENZONATATE 100 MG PO CAPS: 100.0000 mg | ORAL_CAPSULE | Freq: Three times a day (TID) | ORAL | Status: DC | PRN

## 2014-06-19 MED ORDER — DILTIAZEM HCL ER COATED BEADS 180 MG PO CP24: 180.0000 mg | ORAL_CAPSULE | Freq: Every day | ORAL | Status: DC

## 2014-06-19 MED ORDER — AMOXICILLIN-POT CLAVULANATE 875-125 MG PO TABS: 1.0000 | ORAL_TABLET | Freq: Two times a day (BID) | ORAL | Status: DC

## 2014-06-19 MED ORDER — ENSURE ENLIVE PO LIQD: 237.0000 mL | Freq: Three times a day (TID) | ORAL | Status: AC

## 2014-06-19 MED ORDER — GUAIFENESIN-DM 100-10 MG/5ML PO SYRP: 5.0000 mL | ORAL_SOLUTION | ORAL | Status: AC | PRN

## 2014-06-19 MED ORDER — RESOURCE THICKENUP CLEAR PO POWD: ORAL | Status: AC

## 2014-06-19 NOTE — Care Management Note (Signed)
Case Management Note  Patient Details  Name: [name] MRN: [id number] Date of Birth: 14-Dec-1933  Subjective/Objective:    Pt plan for d/c home today with [facility] services.                 Action/Plan: CM did offer choice to pt's husband in ref to [place]. Choice made for [facility] for [facility]. CM did make referral and SOC to begin within 24-48 hours post d/c. CM did call [place] and Eliquis is available, pt was recently on medication and cost is $40.00.  Expected Discharge Date:                  Expected Discharge Plan:  [place]  In-House Referral:     Discharge planning Services  CM Consult  Post Acute Care Choice:  NA Choice offered to:  Spouse  DME Arranged:    DME Agency:     HH Arranged:  PT [place]:  [place]  Status of Service:  Completed, signed off  Medicare Important Message Given:  Yes Date Medicare IM Given:  06/19/14 Medicare IM give by:  Jacqlyn Krauss, RN,BSN  Date Additional Medicare IM Given:    Additional Medicare Important Message give by:     If discussed at [place] of Stay Meetings, dates discussed:    Additional Comments:  [name], RN 06/19/2014, 10:19 AM

## 2014-06-19 NOTE — Progress Notes (Signed)
Speech Language Pathology Treatment: Dysphagia  Patient Details Name: [name] MRN: [id number] DOB: 1934/01/03 Today's Date: 06/19/2014 Time: 8295-6213 SLP Time Calculation (min) (ACUTE ONLY): 10 min  Assessment / Plan / Recommendation Clinical Impression  Pt preparing for D/C home today with [facility] services.  Clarified with CM that pt will need f/u SLP as well as PT.  Pt tolerating nectar-thick liquids without s/s of aspiration.  Lung status has improved.  Reviewed with spouse the necessity of continued use of thickener; SLP f/u now and after appt next week with neurologist.  Husband agrees.  SLP to sign off.    HPI Other Pertinent Information: 81 y.o. female with year-long history of dementia, poor PO intake for several months, generalized weakness, suspicion of ALS (Scheduled for neurology appt at [facility] 06/25/14). Patient was just at [facility] for treatment of A.Fib RVR, cardioverted. Since discharge she has developed wet sounding cough, congestion.  MD has concerns for aspiration given hx of dementia as well as possible ALS.  Daughter reports intermittent cough when swallowing; rapid cognitive decline since January.  MBS 06/16/14 with recs for dys 3 diet, nectar thick liquid. SLP to follow for diet tolerance and education.   Pertinent Vitals Pain Assessment: No/denies pain  SLP Plan  Discharge SLP treatment due to (comment) (d/c home with [facility])    Recommendations Diet recommendations: Dysphagia 3 (mechanical soft);Nectar-thick liquid Liquids provided via: No straw;Cup Medication Administration: Whole meds with puree Supervision: Patient able to self feed;Full supervision/cueing for compensatory strategies Compensations: Slow rate;Small sips/bites;Follow solids with liquid Postural Changes and/or Swallow Maneuvers: Seated upright 90 degrees              Oral Care Recommendations: Oral care BID Follow up Recommendations: 24 hour  supervision/assistance Plan: Discharge SLP treatment due to (comment) (d/c home with [place])    GO     [name] 06/19/2014, 11:49 AM

## 2014-06-19 NOTE — Progress Notes (Signed)
   Patient Name: [name] Date of Encounter: 06/19/2014  Principal Problem:   HCAP (healthcare-associated pneumonia) Active Problems:   Essential hypertension   Dyslipidemia   Atrial fibrillation with RVR   Dementia   Generalized weakness   Protein-calorie malnutrition, severe    SUBJECTIVE  Denies CP or palpitation. Stable SOB.   CURRENT MEDS . amoxicillin-clavulanate  1 tablet Oral BID  . apixaban  5 mg Oral BID  . cholecalciferol  2,000 Units Oral Daily  . dextromethorphan-guaiFENesin  1 tablet Oral BID  . diltiazem  180 mg Oral QHS  . feeding supplement (ENSURE ENLIVE)  237 mL Oral TID BM  . gabapentin  300 mg Oral TID  . metoCLOPramide  5 mg Oral TID AC & HS  . multivitamin with minerals  1 tablet Oral Daily    OBJECTIVE  Filed Vitals:   06/18/14 1344 06/18/14 1440 06/18/14 2025 06/19/14 0500  BP: 152/59 133/54 133/80 114/73  Pulse: 115 100 94 96  Temp: 98.5 F (36.9 C) 98.4 F (36.9 C) 98.6 F (37 C) 97.7 F (36.5 C)  TempSrc: Oral Oral Oral Oral  Resp: 22 20 20 18   Height:      Weight:      SpO2: 96% 93% 96% 90%    Intake/Output Summary (Last 24 hours) at 06/19/14 0934 Last data filed at 06/18/14 2145  Gross per 24 hour  Intake    597 ml  Output      0 ml  Net    597 ml   Filed Weights   06/14/14 1637 06/14/14 2056 06/18/14 0546  Weight: 118 lb (53.524 kg) 119 lb 7.8 oz (54.2 kg) 126 lb 8 oz (57.38 kg)    PHYSICAL EXAM  General: Pleasant woman with O2 in place.  Neuro: Alert and oriented X 3. Moves all extremities spontaneously. Psych: Normal affect. HEENT:  Normal. Horseness.   Neck: Supple without bruits or JVD. Lungs:  Resp regular with O2 in place. Limited breathing effort.  Few bibasilar rales.  Heart: irregular with tachy. No murmur.  Abdomen: Soft, non-tender, non-distended, BS + x 4.  Extremities: No clubbing, cyanosis or edema. DP/PT/Radials 2+ and equal bilaterally.  Accessory Clinical Findings  CBC  Recent Labs  06/18/14 1000  WBC 13.7*  HGB 13.3  HCT 42.6  MCV 93.6  PLT 935   Basic Metabolic Panel  Recent Labs  06/18/14 1000  NA 138  K 4.4  CL 93*  CO2 35*  GLUCOSE 178*  BUN 15  CREATININE 0.64  CALCIUM 8.8*    TELE  A.fib with HR in 80-90s up until ~7AM today. Spike to 140s.    ASSESSMENT AND PLAN  1. Atrial fibrillation with RVR: IV Cardizem infusion on admission-now on oral Cardizem. Cardizem CD increased to 180mg . Flecainide discontinued 5/10. Yesterday her rate went above 100s with activity, now relatively stable. Could consider increasing diltiazem to 240 mg daily outpatient.  BP is stable. CHA2DS2VASc = 4. Continue anticoagulation with Eliquis. Cardiology f/u with Dr. Harrington Challenger.   2. Essential hypertension: Controlled with oral Cardizem.  Signed,   Bhagat,Bhavinkumar PA-C Pager [phone]    I have examined the patient and reviewed assessment and plan and discussed with patient.  Agree with above as stated.  Reasonable rate control. Dilt can be increased as an outpatient, to 240 mg daily if needed.  Cardiology f/u with Dr. Harrington Challenger.  Dayvian Blixt S.

## 2014-06-19 NOTE — Discharge Summary (Signed)
Physician Discharge Summary   Patient ID: [name] MRN: [id number] DOB/AGE: 1933/12/13 81 y.o.  Admit date: 06/14/2014 Discharge date: 06/19/2014  Primary Care Physician:  Dorris Carnes, MD  Discharge Diagnoses:    . HCAP (healthcare-associated pneumonia)/aspiration pneumonia  . Atrial fibrillation with RVR . Dementia . Dyslipidemia . Essential hypertension  Consults:  Cardiology   Recommendations for Outpatient Follow-up:  Please obtain chest x-ray in 2-3 weeks to ensure complete resolution of pneumonia  Diet dysphagia 3, nectar thick liquids  TESTS THAT NEED FOLLOW-UP CBC, and agreement at the time of follow-up appointment   DIET: Heart healthy diet    Allergies:   Allergies  Allergen Reactions  . Aspirin Anaphylaxis     Discharge Medications:   Medication List    STOP taking these medications        flecainide 100 MG tablet  Commonly known as:  TAMBOCOR      TAKE these medications        amoxicillin-clavulanate 875-125 MG per tablet  Commonly known as:  AUGMENTIN  Take 1 tablet by mouth 2 (two) times daily. X 7 days     apixaban 5 MG Tabs tablet  Commonly known as:  ELIQUIS  Take 5 mg by mouth 2 (two) times daily.     benzonatate 100 MG capsule  Commonly known as:  TESSALON PERLES  Take 1 capsule (100 mg total) by mouth 3 (three) times daily as needed for cough.     diltiazem 180 MG 24 hr capsule  Commonly known as:  CARDIZEM CD  Take 1 capsule (180 mg total) by mouth at bedtime.     feeding supplement (ENSURE ENLIVE) Liqd  Take 237 mLs by mouth 3 (three) times daily between meals. Available over the counter     gabapentin 300 MG capsule  Commonly known as:  NEURONTIN  Take 300 mg by mouth 3 (three) times daily.     guaiFENesin-dextromethorphan 100-10 MG/5ML syrup  Commonly known as:  ROBITUSSIN DM  Take 5 mLs by mouth every 4 (four) hours as needed for cough.     metoCLOPramide 5 MG tablet  Commonly known as:  REGLAN  Take 5  mg by mouth 4 (four) times daily -  before meals and at bedtime.     multivitamin with minerals Tabs tablet  Take 1 tablet by mouth daily.     polyethylene glycol powder powder  Commonly known as:  GLYCOLAX/MIRALAX  Take 17 g by mouth daily as needed (constipation). Take 1 scoopful with a large glass of water daily.     [place]  -   - Use as directed     traZODone 50 MG tablet  Commonly known as:  DESYREL  Take 25 mg by mouth at bedtime. For sleep     VITAMIN B-12 PO  Take 1 tablet by mouth daily.     Vitamin D3 2000 UNITS capsule  Take 2,000 Units by mouth daily.         Brief H and P: For complete details please refer to admission H and P, but in brief [name] is a 81 y.o. female with year long history of dementia, poor PO intake for several months, generalized weakness, suspicion of ALS. Patient was just at [facility] a week before the admission for treatment of A.Fib RVR and was cardioverted. Since discharge, she has developed wet sounding cough, congestion. No fever nor chills. Has been put on keflex  as outpatient. Patient was admitted for further workup.   Hospital Course:  HCAP (healthcare-associated pneumonia)/aspiration Patient was admitted and placed on IV vancomycin and cefepime, she had been on Keflex as an outpatient. Patient was seen by speech therapy and found to have aspiration, was placed on dysphagia 3 diet with nectar thick liquids. She has been transitioned to oral Augmentin for 7 days to complete the full course.    Atrial fibrillation with RVR:  Patient was placed on IV Cardizem infusion each significantly improve the rate. Subsequently she was transitioned to oral Cardizem, 180 mg daily. Currently reasonable heart rate control although sometimes increased with the activity, not sustained hence cardiology recommended to continue with 180 mg daily and if needed can be increased outpatient 240  mg daily. CHA2DS2VASc = 4. Anticoagulated with Eliquis.    Essential hypertension:  Controlled with oral Cardizem.    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 Suspect worsening generalized weakness from acute illness/pneumonia   Generalized weakness: Repeat PT evaluation    Severe protein calorie malnutrition:  continue with supplements  Lethargy Resolved, likely due to Xanax which was discontinued    Day of Discharge BP 114/73 mmHg  Pulse 96  Temp(Src) 97.7 F (36.5 C) (Oral)  Resp 18  Ht 5\' 4"  (1.626 m)  Wt 57.38 kg (126 lb 8 oz)  BMI 21.70 kg/m2  SpO2 90%  Physical Exam: General: Alert and awake oriented x3 not in any acute distress. HEENT: anicteric sclera, pupils reactive to light and accommodation CVS: S1-S2 clear no murmur rubs or gallops Chest: clear to auscultation bilaterally, no wheezing rales or rhonchi Abdomen: soft nontender, nondistended, normal bowel sounds Extremities: no cyanosis, clubbing or edema noted bilaterally Neuro: Cranial nerves II-XII intact, no focal neurological deficits   The results of significant diagnostics from this hospitalization (including imaging, microbiology, ancillary and laboratory) are listed below for reference.    LAB RESULTS: Basic Metabolic Panel:  Recent Labs Lab 06/15/14 0831 06/16/14 0755 06/18/14 1000  NA 137 140 138  K 3.7 4.5 4.4  CL 102 102 93*  CO2 28 32 35*  GLUCOSE 107* 89 178*  BUN 7 12 15   CREATININE 0.54 0.54 0.64  CALCIUM 8.5* 8.6* 8.8*  MG 1.8  --   --    Liver Function Tests:  Recent Labs Lab 06/15/14 0831  AST 19  ALT 18  ALKPHOS 65  BILITOT 0.7  PROT 5.0*  ALBUMIN 2.8*   No results for input(s): LIPASE, AMYLASE in the last 168 hours. No results for input(s): AMMONIA in the last 168 hours. CBC:  Recent Labs Lab 06/14/14 1637  06/16/14 0755 06/18/14 1000   WBC 11.4*  < > 13.0* 13.7*  NEUTROABS 9.7*  --   --   --   HGB 15.0  < > 13.2 13.3  HCT 45.2  < > 42.3 42.6  MCV 89.3  < > 92.6 93.6  PLT 411*  < > 317 328  < > = values in this interval not displayed. Cardiac Enzymes:  Recent Labs Lab 06/14/14 1637  TROPONINI <0.03   BNP: Invalid input(s): POCBNP CBG: No results for input(s): GLUCAP in the last 168 hours.  Significant Diagnostic Studies:  Dg Chest Port 1 View  06/14/2014   CLINICAL DATA:  Tachycardia. Cough, 1 week duration. Recent cardiac dysrhythmia. Atrial fibrillation.  EXAM: PORTABLE CHEST - 1 VIEW  COMPARISON:  06/01/2014.  09/30/2013  FINDINGS: Heart size is normal. Mediastinal shadows are normal. There is patchy density in the left lower lobe the could be atelectasis or pneumonia. The right lung is clear. No evidence of edema or effusions.  IMPRESSION: Patchy density in the left lower lobe that could be atelectasis or pneumonia.   Electronically Signed   By: Nelson Chimes M.D.   On: 06/14/2014 17:51    2D ECHO:   Disposition and Follow-up: Discharge Instructions    Discharge instructions    Complete by:  As directed   Mechanical soft diet, NECTAR thick liquids     Increase activity slowly    Complete by:  As directed             DISPOSITION: home with home health PT, OT, RN, home health aide   DISCHARGE FOLLOW-UP Follow-up Information    Follow up with Dorris Carnes, MD On 06/29/2014.   Specialty:  Cardiology   Why:  @ 8:45 post hospital   Contact information:   [address] [place] [address] [phone]       Follow up with [facility].   Why:  Physical  and Speech Therapy   Contact information:   [address] [place] [address] [phone]        Time spent on Discharge: 35 minutes  Signed:   Tynesha Free M.D. [facility] 06/19/2014, 2:39 PM Pager: [phone]

## 2014-06-19 NOTE — Progress Notes (Signed)
Patient has met adequate criteria for discharger per MD order. Patient discharge summary was discussed with and given to patient and family. All required education, follow up appointments, current/new medications, community resources and when to notify the provider was discussed with patient and family. All hospital equipment and invasive lines were removed. Patient left the hospital escorted by hospital transportation via wheelchair with family and patient belongings transported to car via volunteer cart.  

## 2014-06-20 LAB — CULTURE, BLOOD (ROUTINE X 2)
Culture: NO GROWTH
Culture: NO GROWTH

## 2014-06-29 ENCOUNTER — Ambulatory Visit (INDEPENDENT_AMBULATORY_CARE_PROVIDER_SITE_OTHER): Payer: Medicare Other | Admitting: Internal Medicine

## 2014-06-29 ENCOUNTER — Ambulatory Visit: Payer: Medicare Other | Admitting: Adult Health

## 2014-06-29 ENCOUNTER — Encounter: Payer: Self-pay | Admitting: Internal Medicine

## 2014-06-29 VITALS — BP 118/68 | HR 78 | Ht 64.0 in | Wt 118.0 lb

## 2014-06-29 DIAGNOSIS — I4891 Unspecified atrial fibrillation: Secondary | ICD-10-CM | POA: Diagnosis not present

## 2014-06-29 NOTE — Patient Instructions (Signed)
Medication Instructions:  Your physician recommends that you continue on your current medications as directed. Please refer to the Current Medication list given to you today.   Labwork: None   Testing/Procedures: Your physician has recommended that you wear a holter monitor. Holter monitors are medical devices that record the heart's electrical activity. Doctors most often use these monitors to diagnose arrhythmias. Arrhythmias are problems with the speed or rhythm of the heartbeat. The monitor is a small, portable device. You can wear one while you do your normal daily activities. This is usually used to diagnose what is causing palpitations/syncope (passing out). (24 hour)  Follow-Up: Your physician recommends that you schedule a follow-up appointment pending monitor results   Any Other Special Instructions Will Be Listed Below (If Applicable).

## 2014-06-29 NOTE — Progress Notes (Addendum)
Cardiology Office Note   Date:  06/29/2014   ID:  Tashari, Schoenfelder [birth year], MRN [id number]  PCP:  Dorris Carnes, MD  Cardiologist:   Dorris Carnes, MD   No chief complaint on file.  F?U of atrial fib     History of Present Illness: [name] is a 81 y.o. female with a history of Atrial fib and HTN   She was recently discharged from [facility]  Had Afi with RVR   Was treated for pneumonia at time  Since d/c she denies palpitations.   She is very weak  Being seen in neurology for dx of ALS      Current Outpatient Prescriptions  Medication Sig Dispense Refill  . apixaban (ELIQUIS) 5 MG TABS tablet Take 5 mg by mouth 2 (two) times daily.    . Cholecalciferol (VITAMIN D3) 2000 UNITS capsule Take 2,000 Units by mouth daily.    . Cyanocobalamin (VITAMIN B-12 PO) Take 1 tablet by mouth daily.    Marland Kitchen diltiazem (CARDIZEM CD) 180 MG 24 hr capsule Take 1 capsule (180 mg total) by mouth at bedtime. 30 capsule 3  . feeding supplement, ENSURE ENLIVE, (ENSURE ENLIVE) LIQD Take 237 mLs by mouth 3 (three) times daily between meals. Available over the counter 237 mL 12  . gabapentin (NEURONTIN) 300 MG capsule Take 300 mg by mouth 3 (three) times daily.    Marland Kitchen guaiFENesin-dextromethorphan (ROBITUSSIN DM) 100-10 MG/5ML syrup Take 5 mLs by mouth every 4 (four) hours as needed for cough. 118 mL 0  . LORazepam (ATIVAN) 0.5 MG tablet Take 0.5 mg by mouth every 8 (eight) hours.    . Maltodextrin-Xanthan Gum (RESOURCE THICKENUP CLEAR) POWD NECTAR THICK  Use as directed 5 Can 5  . metoCLOPramide (REGLAN) 5 MG tablet Take 5 mg by mouth 4 (four) times daily -  before meals and at bedtime.  5  . Multiple Vitamin (MULTIVITAMIN WITH MINERALS) TABS tablet Take 1 tablet by mouth daily.    . traZODone (DESYREL) 50 MG tablet Take 25 mg by mouth at bedtime. For sleep  0   No current facility-administered medications for this visit.    Allergies:   Aspirin   Past Medical History  Diagnosis  Date  . Hypertension   . Dyslipidemia   . Palpitations   . Depression   . Hyperlipidemia   . GERD (gastroesophageal reflux disease)   . Urinary incontinence   . Hiatal hernia   . Renal cysts, acquired, bilateral   . Cholelithiasis   . Renal angiomyolipoma   . Hemorrhoids   . Tubular adenoma   . Heart murmur   . Abdominal pain   . Coronary artery disease   . PAF (paroxysmal atrial fibrillation)     a.  CHA2DS2VASc = 4-->Eliquis.    Past Surgical History  Procedure Laterality Date  . Eye surgery    . Appendectomy  10/25/10  . Cholecystectomy  10/25/10     Social History:  The patient  reports that she has never smoked. She has never used smokeless tobacco. She reports that she does not drink alcohol or use illicit drugs.   Family History:  The patient's family history includes Cancer in her father and mother; Depression in her mother; Heart disease in her father and mother; Hypertension in her father.    ROS:  Please see the history of present illness. All other systems are reviewed and  Negative to the above problem except as noted.  PHYSICAL EXAM: VS:  BP 118/68 mmHg  Pulse 78  Ht 5\' 4"  (1.626 m)  Wt 118 lb (53.524 kg)  BMI 20.24 kg/m2  SpO2 97%  GEN: Well nourished, well developed, in no acute distress HEENT: normal Neck: no JVD, carotid bruits, or masses Cardiac: Irreg irreg; no murmurs, rubs, or gallops,no edema  Respiratory:  clear to auscultation bilaterally, normal work of breathing GI: soft, nontender, nondistended, + BS  No hepatomegaly  MS: no deformity Moving all extremities   Skin: warm and dry, no rash    EKG:  EKG is not ordered today.   Lipid Panel    Component Value Date/Time   CHOL 164 03/14/2012 1126   TRIG 130.0 03/14/2012 1126   HDL 46.60 03/14/2012 1126   CHOLHDL 4 03/14/2012 1126   VLDL 26.0 03/14/2012 1126   LDLCALC 91 03/14/2012 1126      Wt Readings from Last 3 Encounters:  06/29/14 118 lb (53.524 kg)  06/18/14 126 lb 8  oz (57.38 kg)  10/07/13 127 lb 3.3 oz (57.7 kg)      ASSESSMENT AND PLAN:  1.  Atrial fib  Keep on rate contol and eliquis  Set up for Holter monitor    2.  HTN  BP is OK     Signed, Dorris Carnes, MD  06/29/2014 9:29 AM    [address], [place], [place]  [address] Phone: [phone]; Fax: [phone]

## 2014-06-30 ENCOUNTER — Ambulatory Visit (INDEPENDENT_AMBULATORY_CARE_PROVIDER_SITE_OTHER): Payer: Medicare Other

## 2014-06-30 DIAGNOSIS — I4891 Unspecified atrial fibrillation: Secondary | ICD-10-CM | POA: Diagnosis not present

## 2014-07-07 ENCOUNTER — Telehealth: Payer: Self-pay | Admitting: Internal Medicine

## 2014-07-07 NOTE — Telephone Encounter (Signed)
Follow Up       Returning Pat's phone call. Please call back and advise. States that she has a confidential vm and the verbal orders can be left on her vm if she isn't in her office.

## 2014-07-07 NOTE — Telephone Encounter (Signed)
I placed call to speech pathologist to determine if request for additional orders to assess swallowing function might be better addressed by primary care or neurology. I left this information on identified voicemail with request to call back to discuss.

## 2014-07-07 NOTE — Telephone Encounter (Signed)
Calling to request one additional visit for swallowing function-pls call

## 2014-07-07 NOTE — Telephone Encounter (Signed)
Spoke with speech therapist. Pt was sent home from hospital on nectar thick liquids. Therapist has been providing education and is requesting order for one additional educational visit.  [facility] Harrington Challenger as primary provider.  I gave approval for one more visit and asked for additional follow up to be addressed by primary care.  Pt has new patient appt tomorrow with primary care provider at [facility].

## 2014-07-07 NOTE — Telephone Encounter (Signed)
Left message to call back  

## 2014-07-08 ENCOUNTER — Encounter: Payer: Self-pay | Admitting: Adult Health

## 2014-07-08 ENCOUNTER — Ambulatory Visit (INDEPENDENT_AMBULATORY_CARE_PROVIDER_SITE_OTHER)
Admission: RE | Admit: 2014-07-08 | Discharge: 2014-07-08 | Disposition: A | Discharge status: Home / Self Care | Payer: Medicare Other | Source: Ambulatory Visit | Attending: Adult Health | Admitting: Adult Health

## 2014-07-08 ENCOUNTER — Ambulatory Visit (INDEPENDENT_AMBULATORY_CARE_PROVIDER_SITE_OTHER): Payer: Medicare Other | Admitting: Adult Health

## 2014-07-08 VITALS — BP 108/78 | HR 86 | Temp 98.6°F | Ht 64.0 in | Wt 116.0 lb

## 2014-07-08 DIAGNOSIS — F0390 Unspecified dementia without behavioral disturbance: Secondary | ICD-10-CM

## 2014-07-08 DIAGNOSIS — Z7689 Persons encountering health services in other specified circumstances: Secondary | ICD-10-CM

## 2014-07-08 DIAGNOSIS — R0602 Shortness of breath: Secondary | ICD-10-CM | POA: Diagnosis not present

## 2014-07-08 DIAGNOSIS — R45851 Suicidal ideations: Secondary | ICD-10-CM

## 2014-07-08 DIAGNOSIS — R0789 Other chest pain: Secondary | ICD-10-CM

## 2014-07-08 DIAGNOSIS — R630 Anorexia: Secondary | ICD-10-CM

## 2014-07-08 DIAGNOSIS — Z23 Encounter for immunization: Secondary | ICD-10-CM | POA: Diagnosis not present

## 2014-07-08 DIAGNOSIS — Z7189 Other specified counseling: Secondary | ICD-10-CM

## 2014-07-08 DIAGNOSIS — Z09 Encounter for follow-up examination after completed treatment for conditions other than malignant neoplasm: Secondary | ICD-10-CM

## 2014-07-08 MED ORDER — MIRTAZAPINE 15 MG PO TABS: 15.0000 mg | ORAL_TABLET | Freq: Every day | ORAL | Status: DC

## 2014-07-08 NOTE — Telephone Encounter (Signed)
Informed husband that the patient's chest x ray showed that the pneumonia has resolved.

## 2014-07-08 NOTE — Progress Notes (Signed)
HPI:  [name] is a 81 year old Caucasian nonsmoking female who presents with her husband to establish care. She has a significant past medical history including atriall fibrillation,hypertension, depressive disorder, dementia, ALS. He was recently discharged on 06/19/2014 for possible aspiration pneumonia she was placed on IV vancomycin and cefepime. The week prior to that she was admitted to [facility] for treatment of A. fib and RVR at which time she was cardioverted. Approximately 3 months ago she was diagnosed with depression, and 2 weeks ago per her husband she is was diagnosed with ALS. she is followed by neurology for dementia and she is being seen at [facility] for her ALS.    Last PCP and physical:Unknown Immunizations: UTD Diet: Loss of appetitive.  Exercise:walks around the house, but no other exercise Colonoscopy:2014 Dexa:2008 Mammogram:2015  Has the following chronic problems that require follow up and concerns today:  Depression She presents in the office today with severe depression and suicidal ideation for approximately last 3 months after being diagnosed with dementia he has suffered with depression in the past and per husband it was controlled. When asked if she is suicidal that patient states "yes". Asked if she had a plan the patient states "I would kill myself with a gun". Husband does state that there are guns in the house but they are currently locked up in a safe. Wife does not know how to get into the safe. Her husband does endorse that her suicidal ideation has been going on for the last 3 months she has not had any type of suicidal attempt. She does not endorse any homicidal ideation.  Decreased Appetite  The patient and the husband both endorse the patient having a decreased appetite. The patient states "I do not feel like eating". She also endorses 34 pounds of unintentional weight loss over the last year.  Shortness of  Breath He continues to endorse shortness of breath after being discharged from the hospital. She has finished all her antibiotics for community-acquired pneumonia. She does not endorse feeling as though she is struggling to breathe.   Left chest wall pain Patient fell in her bedroom approximately 6 or 7 months ago denies any injury during the fall. Since that time she has had left chest wall pain. Her husband endorses having ultrasound and CAT scan and x-rays done as well as blood work, there has been no finding of why she has this chest wall pain area   ROS negative for unless reported above: fevers, chills chest pain, palpitations, leg claudication, struggling to breath,Not feeling congested in the chest, no orthopenia, no cough,no wheezing,  no soft tissue swelling, no hemoptysis, melena, hematochezia, hematuria,  Immunizations:   Past Medical History  Diagnosis Date  . Hypertension   . Dyslipidemia   . Palpitations   . Depression   . Hyperlipidemia   . GERD (gastroesophageal reflux disease)   . Urinary incontinence   . Hiatal hernia   . Renal cysts, acquired, bilateral   . Cholelithiasis   . Renal angiomyolipoma   . Hemorrhoids   . Tubular adenoma   . Heart murmur   . Abdominal pain   . Coronary artery disease   . PAF (paroxysmal atrial fibrillation)     a.  CHA2DS2VASc = 4-->Eliquis.    Past Surgical History  Procedure Laterality Date  . Eye surgery    . Appendectomy  10/25/10  . Cholecystectomy  10/25/10    Family History  Problem Relation Age of  Onset  . Heart disease Mother   . Depression Mother   . Cancer Mother     colon cancer  . Hypertension Father   . Heart disease Father   . Cancer Father     prostate cancer    History   Social History  . Marital Status: Married    Spouse Name: N/A  . Number of Children: N/A  . Years of Education: N/A   Social History Main Topics  . Smoking status: Never Smoker   . Smokeless tobacco: Never Used  . Alcohol  Use: No  . Drug Use: No  . Sexual Activity: Not on file   Other Topics Concern  . Not on file   Social History Narrative     Current outpatient prescriptions:  .  apixaban (ELIQUIS) 5 MG TABS tablet, Take 5 mg by mouth 2 (two) times daily., Disp: , Rfl:  .  Cholecalciferol (VITAMIN D3) 2000 UNITS capsule, Take 2,000 Units by mouth daily., Disp: , Rfl:  .  Cyanocobalamin (VITAMIN B-12 PO), Take 1 tablet by mouth daily., Disp: , Rfl:  .  feeding supplement, ENSURE ENLIVE, (ENSURE ENLIVE) LIQD, Take 237 mLs by mouth 3 (three) times daily between meals. Available over the counter, Disp: 237 mL, Rfl: 12 .  gabapentin (NEURONTIN) 300 MG capsule, Take 300 mg by mouth 3 (three) times daily., Disp: , Rfl:  .  LORazepam (ATIVAN) 0.5 MG tablet, Take 0.5 mg by mouth every 8 (eight) hours., Disp: , Rfl:  .  Maltodextrin-Xanthan Gum (RESOURCE THICKENUP CLEAR) POWD, NECTAR THICK  Use as directed, Disp: 5 Can, Rfl: 5 .  metoCLOPramide (REGLAN) 5 MG tablet, Take 5 mg by mouth 4 (four) times daily -  before meals and at bedtime., Disp: , Rfl: 5 .  Multiple Vitamin (MULTIVITAMIN WITH MINERALS) TABS tablet, Take 1 tablet by mouth daily., Disp: , Rfl:  .  traZODone (DESYREL) 50 MG tablet, Take 25 mg by mouth at bedtime. For sleep, Disp: , Rfl: 0 .  diltiazem (CARDIZEM CD) 180 MG 24 hr capsule, Take 1 capsule (180 mg total) by mouth at bedtime. (Patient not taking: Reported on 07/08/2014), Disp: 30 capsule, Rfl: 3 .  guaiFENesin-dextromethorphan (ROBITUSSIN DM) 100-10 MG/5ML syrup, Take 5 mLs by mouth every 4 (four) hours as needed for cough. (Patient not taking: Reported on 07/08/2014), Disp: 118 mL, Rfl: 0  EXAM:  Filed Vitals:   07/08/14 1409  BP: 108/78  Pulse: 86  Temp: 98.6 F (37 C)    Body mass index is 19.9 kg/(m^2).  GENERAL: vitals reviewed and listed above, alert, oriented, appears well hydrated. She is able to carry on a conversation but sometimes has trouble finding the correct words. At  times she has garbled speech.   HEENT: atraumatic, conjunttiva clear, no obvious abnormalities on inspection of external nose and ears. She is wearing glasses.   NECK: Neck is soft and supple without masses, no adenopathy or thyromegaly, trachea midline, no JVD. Normal range of motion.   LUNGS: clear to auscultation bilaterally, no wheezes, rales or rhonchi, decreased air movement throughout  CV: Atrial Fibrillation, no audible murmurs, gallops, or rubs. No carotid bruit and no peripheral edema.   MS: moves all extremities without noticeable abnormality. No edema noted. No trauma noted to left chest wall  Abd: soft/nontender/nondistended/normal bowel sounds.  Skin: warm and dry, no rash   Extremities: No clubbing, cyanosis, or edema. Capillary refill is WNL. Pulses intact bilaterally in upper and lower extremities.   Neuro:  CN II-XII intact, sensation and reflexes normal throughout, 5/5 muscle strength in bilateral upper and lower extremities. Normal finger to nose. Normal rapid alternating movements.   PSYCH: Depressed but cooperative.   ASSESSMENT AND PLAN:  Discussed the following assessment and plan:  1. Shortness of breath - DG Chest 2 View; Future - Will follow up with patient with results of chest x ray - Go to the ER with any increased SOB - Follow up as needed  2. Need for pneumococcal vaccination - Pneumococcal conjugate vaccine 13-valent IM  3. Encounter to establish care Follow up in 2 weeks for recheck  Follow up sooner if needed  4. Dementia, without behavioral disturbance - Followed by Neurology   5. Suicidal ideation -I advised the patient and her husband to report to [facility] emergency room due to  suicidal ideation. Both dismissed that idea. I did not feel as though it was necessary to have police escort the patient to the emergency room at this time. She has adequate supervision at home by her husband who is there at all times with her as well as she  has no way of getting into the locked safe where the guns are kept. Distally, she has not had any suicidal attempts in the last 3 months. She and her husband were advised that if the patient had any attempts to report complete to the emergency room or call 911. The patient, her husband, myself, and Medical laboratory scientific officer signed a contract stating that the patient would not harm herself in any capacity and that if she had thoughts of harming herself she would report directly to the emergency room. A contract was placed in her chart.  6. Left-sided chest wall pain -No trauma to the area noted. There was no pain on palpation to left anterior or posterior chest wall. Advised patient to try Tylenol or ibuprofen for pain relief. She can also try heat or cold packs. We'll follow-up on this during her next visit  7. Decreased appetite Remeron 15 mg taken nightly was prescribed as an appetite stimulant as well as antidepressant for this patient. She is to eat whatever she would like. I will follow-up with this during her next appointment.  8. Hospital discharge follow-up [place] x-ray was ordered status post CAP. Will inform patient of results. Consider additional antibiotic therapy.   Greater than 40 minutes was spent discussing medical problems with patient.   No diagnosis found. -We reviewed the PMH, PSH, FH, SH, Meds and Allergies. -We provided refills for any medications we will prescribe as needed. -We addressed current concerns per orders and patient instructions. -We have asked for records for pertinent exams, studies, vaccines and notes from previous providers. -We have advised patient to follow up per instructions below.   -Patient advised to return or notify a provider immediately if symptoms worsen or persist or new concerns arise.  There are no Patient Instructions on file for this visit.   [employer]

## 2014-07-08 NOTE — Patient Instructions (Signed)
I will follow up with you on the chest x ray.  I have sent a prescription to the pharmacy for Remeron. Please take one pill at night. This will make you sleepy.   Follow up with me in three weeks or sooner if needed.   If you have any thoughts of SI you need to go to the ER.

## 2014-07-14 ENCOUNTER — Emergency Department (HOSPITAL_COMMUNITY)
Admission: EM | Admit: 2014-07-14 | Discharge: 2014-07-14 | Disposition: A | Discharge status: Home / Self Care | Payer: Medicare Other | Attending: Emergency Medicine | Admitting: Emergency Medicine

## 2014-07-14 ENCOUNTER — Encounter (HOSPITAL_COMMUNITY): Payer: Self-pay | Admitting: *Deleted

## 2014-07-14 ENCOUNTER — Emergency Department (HOSPITAL_COMMUNITY): Payer: Medicare Other

## 2014-07-14 DIAGNOSIS — Y9289 Other specified places as the place of occurrence of the external cause: Secondary | ICD-10-CM | POA: Insufficient documentation

## 2014-07-14 DIAGNOSIS — F329 Major depressive disorder, single episode, unspecified: Secondary | ICD-10-CM | POA: Diagnosis not present

## 2014-07-14 DIAGNOSIS — Z79899 Other long term (current) drug therapy: Secondary | ICD-10-CM | POA: Insufficient documentation

## 2014-07-14 DIAGNOSIS — Y9389 Activity, other specified: Secondary | ICD-10-CM | POA: Insufficient documentation

## 2014-07-14 DIAGNOSIS — S0083XA Contusion of other part of head, initial encounter: Secondary | ICD-10-CM | POA: Insufficient documentation

## 2014-07-14 DIAGNOSIS — E785 Hyperlipidemia, unspecified: Secondary | ICD-10-CM | POA: Insufficient documentation

## 2014-07-14 DIAGNOSIS — K219 Gastro-esophageal reflux disease without esophagitis: Secondary | ICD-10-CM | POA: Diagnosis not present

## 2014-07-14 DIAGNOSIS — W01198A Fall on same level from slipping, tripping and stumbling with subsequent striking against other object, initial encounter: Secondary | ICD-10-CM | POA: Insufficient documentation

## 2014-07-14 DIAGNOSIS — I2510 Atherosclerotic heart disease of native coronary artery without angina pectoris: Secondary | ICD-10-CM | POA: Diagnosis not present

## 2014-07-14 DIAGNOSIS — I10 Essential (primary) hypertension: Secondary | ICD-10-CM | POA: Diagnosis not present

## 2014-07-14 DIAGNOSIS — Z87448 Personal history of other diseases of urinary system: Secondary | ICD-10-CM | POA: Diagnosis not present

## 2014-07-14 DIAGNOSIS — R011 Cardiac murmur, unspecified: Secondary | ICD-10-CM | POA: Insufficient documentation

## 2014-07-14 DIAGNOSIS — I480 Paroxysmal atrial fibrillation: Secondary | ICD-10-CM | POA: Insufficient documentation

## 2014-07-14 DIAGNOSIS — Y998 Other external cause status: Secondary | ICD-10-CM | POA: Diagnosis not present

## 2014-07-14 DIAGNOSIS — S0990XA Unspecified injury of head, initial encounter: Secondary | ICD-10-CM | POA: Insufficient documentation

## 2014-07-14 DIAGNOSIS — W19XXXA Unspecified fall, initial encounter: Secondary | ICD-10-CM

## 2014-07-14 NOTE — Discharge Instructions (Signed)
Head Injury °You have received a head injury. It does not appear serious at this time. Headaches and vomiting are common following head injury. It should be easy to awaken from sleeping. Sometimes it is necessary for you to stay in the emergency department for a while for observation. Sometimes admission to the hospital may be needed. After injuries such as yours, most problems occur within the first 24 hours, but side effects may occur up to 7-10 days after the injury. It is important for you to carefully monitor your condition and contact your health care provider or seek immediate medical care if there is a change in your condition. °WHAT ARE THE TYPES OF HEAD INJURIES? °Head injuries can be as minor as a bump. Some head injuries can be more severe. More severe head injuries include: °· A jarring injury to the brain (concussion). °· A bruise of the brain (contusion). This mean there is bleeding in the brain that can cause swelling. °· A cracked skull (skull fracture). °· Bleeding in the brain that collects, clots, and forms a bump (hematoma). °WHAT CAUSES A HEAD INJURY? °A serious head injury is most likely to happen to someone who is in a car wreck and is not wearing a seat belt. Other causes of major head injuries include bicycle or motorcycle accidents, sports injuries, and falls. °HOW ARE HEAD INJURIES DIAGNOSED? °A complete history of the event leading to the injury and your current symptoms will be helpful in diagnosing head injuries. Many times, pictures of the brain, such as CT or MRI are needed to see the extent of the injury. Often, an overnight hospital stay is necessary for observation.  °WHEN SHOULD I SEEK IMMEDIATE MEDICAL CARE?  °You should get help right away if: °· You have confusion or drowsiness. °· You feel sick to your stomach (nauseous) or have continued, forceful vomiting. °· You have dizziness or unsteadiness that is getting worse. °· You have severe, continued headaches not relieved by  medicine. Only take over-the-counter or prescription medicines for pain, fever, or discomfort as directed by your health care provider. °· You do not have normal function of the arms or legs or are unable to walk. °· You notice changes in the black spots in the center of the colored part of your eye (pupil). °· You have a clear or bloody fluid coming from your nose or ears. °· You have a loss of vision. °During the next 24 hours after the injury, you must stay with someone who can watch you for the warning signs. This person should contact local emergency services (911 in the [place] if you have seizures, you become unconscious, or you are unable to wake up. °HOW CAN I PREVENT A HEAD INJURY IN THE FUTURE? °The most important factor for preventing major head injuries is avoiding motor vehicle accidents.  To minimize the potential for damage to your head, it is crucial to wear seat belts while riding in motor vehicles. Wearing helmets while bike riding and playing collision sports (like football) is also helpful. Also, avoiding dangerous activities around the house will further help reduce your risk of head injury.  °WHEN CAN I RETURN TO NORMAL ACTIVITIES AND ATHLETICS? °You should be reevaluated by your health care provider before returning to these activities. If you have any of the following symptoms, you should not return to activities or contact sports until 1 week after the symptoms have stopped: °· Persistent headache. °· Dizziness or vertigo. °· Poor attention and concentration. °· Confusion. °·   Memory problems.  Nausea or vomiting.  Fatigue or tire easily.  Irritability.  Intolerant of bright lights or loud noises.  Anxiety or depression.  Disturbed sleep. MAKE SURE YOU:   Understand these instructions.  Will watch your condition.  Will get help right away if you are not doing well or get worse. Document Released: 01/23/2005 Document Revised: 01/28/2013 Document Reviewed:  09/30/2012 [facility] Patient Information 2015 [place], [place]. This information is not intended to replace advice given to you by your health care provider. Make sure you discuss any questions you have with your health care provider. Hematoma A hematoma is a collection of blood under the skin, in an organ, in a body space, in a joint space, or in other tissue. The blood can clot to form a lump that you can see and feel. The lump is often firm and may sometimes become sore and tender. Most hematomas get better in a few days to weeks. However, some hematomas may be serious and require medical care. Hematomas can range in size from very small to very large. CAUSES  A hematoma can be caused by a blunt or penetrating injury. It can also be caused by spontaneous leakage from a blood vessel under the skin. Spontaneous leakage from a blood vessel is more likely to occur in older people, especially those taking blood thinners. Sometimes, a hematoma can develop after certain medical procedures. SIGNS AND SYMPTOMS   A firm lump on the body.  Possible pain and tenderness in the area.  Bruising.Blue, dark blue, purple-red, or yellowish skin may appear at the site of the hematoma if the hematoma is close to the surface of the skin. For hematomas in deeper tissues or body spaces, the signs and symptoms may be subtle. For example, an intra-abdominal hematoma may cause abdominal pain, weakness, fainting, and shortness of breath. An intracranial hematoma may cause a headache or symptoms such as weakness, trouble speaking, or a change in consciousness. DIAGNOSIS  A hematoma can usually be diagnosed based on your medical history and a physical exam. Imaging tests may be needed if your health care provider suspects a hematoma in deeper tissues or body spaces, such as the abdomen, head, or chest. These tests may include ultrasonography or a CT scan.  TREATMENT  Hematomas usually go away on their own over time. Rarely  does the blood need to be drained out of the body. Large hematomas or those that may affect vital organs will sometimes need surgical drainage or monitoring. HOME CARE INSTRUCTIONS   Apply ice to the injured area:   Put ice in a plastic bag.   Place a towel between your skin and the bag.   Leave the ice on for 20 minutes, 2-3 times a day for the first 1 to 2 days.   After the first 2 days, switch to using warm compresses on the hematoma.   Elevate the injured area to help decrease pain and swelling. Wrapping the area with an elastic bandage may also be helpful. Compression helps to reduce swelling and promotes shrinking of the hematoma. Make sure the bandage is not wrapped too tight.   If your hematoma is on a lower extremity and is painful, crutches may be helpful for a couple days.   Only take over-the-counter or prescription medicines as directed by your health care provider. SEEK IMMEDIATE MEDICAL CARE IF:   You have increasing pain, or your pain is not controlled with medicine.   You have a fever.   You  have worsening swelling or discoloration.   Your skin over the hematoma breaks or starts bleeding.   Your hematoma is in your chest or abdomen and you have weakness, shortness of breath, or a change in consciousness.  Your hematoma is on your scalp (caused by a fall or injury) and you have a worsening headache or a change in alertness or consciousness. MAKE SURE YOU:   Understand these instructions.  Will watch your condition.  Will get help right away if you are not doing well or get worse. Document Released: 09/07/2003 Document Revised: 09/25/2012 Document Reviewed: 07/03/2012 [facility] Patient Information 2015 [place], [place]. This information is not intended to replace advice given to you by your health care provider. Make sure you discuss any questions you have with your health care provider.

## 2014-07-14 NOTE — ED Provider Notes (Signed)
CSN: [id number]     Arrival date & time 07/14/14  1748 History   First MD Initiated Contact with Patient 07/14/14 1959     Chief Complaint  Patient presents with  . Head Injury     (Consider location/radiation/quality/duration/timing/severity/associated sxs/prior Treatment) HPI The patient was bending over to pick up her dog. She lost her balance and fell forward onto her forehead. She did not have loss of consciousness. The patient denies headache. She did develop a large forehead hematoma. The patient does take L Quist for H of fibrillation. There has not been any confusion or change in baseline motor function. The patient does have ALS. She does not endorse visual changes or nausea or vomiting. He denies other associated injury except minor contusion to her left knee. She was able to ambulate at baseline. Past Medical History  Diagnosis Date  . Hypertension   . Dyslipidemia   . Palpitations   . Depression   . Hyperlipidemia   . GERD (gastroesophageal reflux disease)   . Urinary incontinence   . Hiatal hernia   . Renal cysts, acquired, bilateral   . Cholelithiasis   . Renal angiomyolipoma   . Hemorrhoids   . Tubular adenoma   . Heart murmur   . Abdominal pain   . Coronary artery disease   . PAF (paroxysmal atrial fibrillation)     a.  CHA2DS2VASc = 4-->Eliquis.   Past Surgical History  Procedure Laterality Date  . Eye surgery    . Appendectomy  10/25/10  . Cholecystectomy  10/25/10   Family History  Problem Relation Age of Onset  . Heart disease Mother   . Depression Mother   . Cancer Mother     colon cancer  . Hypertension Father   . Heart disease Father   . Cancer Father     prostate cancer   History  Substance Use Topics  . Smoking status: Never Smoker   . Smokeless tobacco: Never Used  . Alcohol Use: No   OB History    No data available     Review of Systems 10 Systems reviewed and are negative for acute change except as noted in the  HPI.    Allergies  Aspirin  Home Medications   Prior to Admission medications   Medication Sig Start Date End Date Taking? Authorizing Provider  apixaban (ELIQUIS) 5 MG TABS tablet Take 5 mg by mouth 2 (two) times daily.    Historical Provider, MD  Cholecalciferol (VITAMIN D3) 2000 UNITS capsule Take 2,000 Units by mouth daily.    Historical Provider, MD  Cyanocobalamin (VITAMIN B-12 PO) Take 1 tablet by mouth daily.    Historical Provider, MD  diltiazem (CARDIZEM CD) 180 MG 24 hr capsule Take 1 capsule (180 mg total) by mouth at bedtime. Patient not taking: Reported on 07/08/2014 06/19/14   Ripudeep Krystal Eaton, MD  feeding supplement, ENSURE ENLIVE, (ENSURE ENLIVE) LIQD Take 237 mLs by mouth 3 (three) times daily between meals. Available over the counter 06/19/14   Ripudeep K Rai, MD  gabapentin (NEURONTIN) 300 MG capsule Take 300 mg by mouth 3 (three) times daily. 05/27/14   Historical Provider, MD  guaiFENesin-dextromethorphan (ROBITUSSIN DM) 100-10 MG/5ML syrup Take 5 mLs by mouth every 4 (four) hours as needed for cough. Patient not taking: Reported on 07/08/2014 06/19/14   Ripudeep Krystal Eaton, MD  LORazepam (ATIVAN) 0.5 MG tablet Take 0.5 mg by mouth every 8 (eight) hours.    Historical Provider, MD  Maltodextrin-Xanthan Gum ([place]  THICKENUP CLEAR) POWD NECTAR THICK  Use as directed 06/19/14   Ripudeep Krystal Eaton, MD  metoCLOPramide (REGLAN) 5 MG tablet Take 5 mg by mouth 4 (four) times daily -  before meals and at bedtime. 05/25/14   Historical Provider, MD  mirtazapine (REMERON) 15 MG tablet Take 1 tablet (15 mg total) by mouth at bedtime. 07/08/14   Dorothyann Peng, NP  Multiple Vitamin (MULTIVITAMIN WITH MINERALS) TABS tablet Take 1 tablet by mouth daily.    Historical Provider, MD  traZODone (DESYREL) 50 MG tablet Take 25 mg by mouth at bedtime. For sleep 06/03/14   Historical Provider, MD   BP 126/91 mmHg  Pulse 96  Temp(Src) 98 F (36.7 C) (Oral)  Resp 20  Wt 117 lb (53.071 kg)  SpO2  100% Physical Exam  Constitutional: She is oriented to person, place, and time. She appears well-developed and well-nourished.  HENT:  Right Ear: External ear normal.  Left Ear: External ear normal.  Nose: Nose normal.  Mouth/Throat: Oropharynx is clear and moist.  8 cm hematoma to the left forehead. Faint superficial linear abrasion 5 cm. No associated laceration.  Eyes: EOM are normal. Pupils are equal, round, and reactive to light.  Neck: Neck supple.  No C-spine tenderness.  Cardiovascular: Intact distal pulses.   Heart rate is irregularly irregular. No gross rub murmur or gallop.  Pulmonary/Chest: Effort normal and breath sounds normal. She exhibits no tenderness.  Abdominal: Soft. Bowel sounds are normal. She exhibits no distension. There is no tenderness.  Musculoskeletal: Normal range of motion. She exhibits no edema or tenderness.  Neurological: She is alert and oriented to person, place, and time. She has normal strength. No cranial nerve deficit. She exhibits normal muscle tone. Coordination normal. GCS eye subscore is 4. GCS verbal subscore is 5. GCS motor subscore is 6.  Patient falls commands for assisting in motor strength testing at baseline.  Skin: Skin is warm, dry and intact.  Psychiatric: She has a normal mood and affect.    ED Course  Procedures (including critical care time) Labs Review Labs Reviewed - No data to display  Imaging Review Ct Head Wo Contrast  07/14/2014   CLINICAL DATA:  Trauma after fall onto concrete. Blood thinner use. Initial encounter.  EXAM: CT HEAD WITHOUT CONTRAST  TECHNIQUE: Contiguous axial images were obtained from the base of the skull through the vertex without intravenous contrast.  COMPARISON:  04/18/2012 brain MRI  FINDINGS: Skull and Sinuses:Left frontal scalp contusion and laceration with soft tissue gas. No opaque foreign body or fracture.  Orbits: No acute abnormality.  Brain: No evidence of acute infarction, hemorrhage,  hydrocephalus, or mass lesion/mass effect. Generalized atrophy, similar to previous MRI. There is mild for age chronic small vessel disease with ischemic gliosis best visualized around the frontal horns the lateral ventricles.  IMPRESSION: 1. No evidence of intracranial injury. 2. Left frontal scalp contusion and laceration without fracture.   Electronically Signed   By: Monte Fantasia M.D.   On: 07/14/2014 18:58     EKG Interpretation None      MDM   Final diagnoses:  Fall, initial encounter  Head injury, initial encounter  Traumatic hematoma of forehead, initial encounter   CT does not show any intracranial bleed. Patient does not have associated headache or mental status change. She has good support at home for ongoing monitoring for any changes in mental status or baseline function. Instructions are provided for returning if there should be development of headache, mental  status change or other change in baseline neurologic function.    Charlesetta Shanks, MD 07/14/14 2027

## 2014-07-14 NOTE — ED Notes (Signed)
Pt leaned over to pick up her dog and slipped, hit her head on concrete, bruising noted, pt takes blood thinner, denies LOC, alert and oriented

## 2014-07-14 NOTE — ED Notes (Signed)
Patient not in room yet 

## 2014-07-14 NOTE — ED Notes (Signed)
Dr. Dorothyann Gibbs at the bedside.

## 2014-07-23 ENCOUNTER — Telehealth: Payer: Self-pay | Admitting: *Deleted

## 2014-07-23 MED ORDER — DILTIAZEM HCL ER COATED BEADS 240 MG PO CP24: 240.0000 mg | ORAL_CAPSULE | Freq: Every day | ORAL | Status: AC

## 2014-07-23 NOTE — Telephone Encounter (Signed)
Notes recorded by Dr. Harrington Challenger regarding holter results:  Atrial fibrillation. 49 to 170 bpm Average HR 100 bpm Longest pause 1.77 seconds Occasional PVC I would recomm increasing diltiazem to 240 mg for a little better HR control Will continue to follow BP and HR after.   Spoke with patient's husband, they are out of town and he would like to pick the medication up when they return next Wednesday. Until then she will continue taking the 180 mg once daily. He will continue to monitor her HR and BP.

## 2014-08-05 ENCOUNTER — Ambulatory Visit (INDEPENDENT_AMBULATORY_CARE_PROVIDER_SITE_OTHER): Payer: Medicare Other | Admitting: Adult Health

## 2014-08-05 ENCOUNTER — Encounter: Payer: Self-pay | Admitting: Adult Health

## 2014-08-05 ENCOUNTER — Ambulatory Visit: Payer: Medicare Other | Admitting: Adult Health

## 2014-08-05 VITALS — BP 116/80 | Temp 97.6°F | Ht 64.0 in | Wt 116.1 lb

## 2014-08-05 DIAGNOSIS — F329 Major depressive disorder, single episode, unspecified: Secondary | ICD-10-CM

## 2014-08-05 DIAGNOSIS — R630 Anorexia: Secondary | ICD-10-CM | POA: Diagnosis not present

## 2014-08-05 DIAGNOSIS — F32A Depression, unspecified: Secondary | ICD-10-CM

## 2014-08-05 MED ORDER — MIRTAZAPINE 30 MG PO TABS: 30.0000 mg | ORAL_TABLET | Freq: Every day | ORAL | Status: AC

## 2014-08-05 NOTE — Patient Instructions (Signed)
I have sent a prescription for an increase in the Remeron to the pharmacy. Please take 1.5 pills at bedtime  Please follow up with me in the last week of July for a complete physical.   If you need anything in the mean time, please let me know.!

## 2014-08-05 NOTE — Progress Notes (Signed)
   Subjective:    Patient ID: [name], female    DOB: 07/13/1933, 81 y.o.   MRN: [id number]  HPI  Patient presents to the office today for three week follow up. I prescribed Remeron 15 mg for depression and as an appetitie stimulant.   She endorses that her depression is better and that she no longer has suicidal ideation since being on Remeron, but that she does not have an appetitie and is not sleeping any better.  Her husband endorses that there is a little improvement in her mood.   She was seen in the ER on 6/7 for a fall she substained while picking up her dog. She hit her head, is on blood thinners. Denies any headache or blurred vision.   She has an appointment with the [facility] in July 2016 for ALS and dementia - at [facility].   Denies any other complaints at this time  Review of Systems  Constitutional: Negative.   HENT: Negative.   Respiratory: Negative.   Gastrointestinal: Negative.   Musculoskeletal: Negative.   Neurological: Negative.   Psychiatric/Behavioral: Positive for confusion and sleep disturbance.  All other systems reviewed and are negative.      Objective:   Physical Exam  Constitutional: She is oriented to person, place, and time. She appears well-developed and well-nourished. No distress.  Eyes: Conjunctivae are normal. Pupils are equal, round, and reactive to light. Right eye exhibits no discharge. Left eye exhibits no discharge.  Neck: Normal range of motion. Neck supple.  Cardiovascular: Normal rate, regular rhythm, normal heart sounds and intact distal pulses.  Exam reveals no gallop and no friction rub.   No murmur heard. Pulmonary/Chest: Effort normal and breath sounds normal. No respiratory distress. She has no wheezes. She has no rales. She exhibits no tenderness.  Neurological: She is alert and oriented to person, place, and time.  Skin: Skin is warm and dry. No rash noted. She is not diaphoretic. No erythema. No pallor.    Psychiatric: She has a normal mood and affect. Her behavior is normal. Judgment and thought content normal.  She looks happier this visit.    Nursing note and vitals reviewed.      Assessment & Plan:  1. Depression - Seems to be improving - mirtazapine (REMERON) 30 MG tablet; Take 1 tablet (30 mg total) by mouth at bedtime.  Dispense: 30 tablet; Refill: 3. Take 1.5 tabs  - Follow up in 4 weeks for CPE and medication check.  2. Loss of appetite - she is up 3 pounds since last visit in office.  - mirtazapine (REMERON) 30 MG tablet; Take 1 tablet (30 mg total) by mouth at bedtime.  Dispense: 30 tablet; Refill: 3. Take 1.5 tabs.

## 2014-08-05 NOTE — Progress Notes (Signed)
Pre visit review using our clinic review tool, if applicable. No additional management support is needed unless otherwise documented below in the visit note. 

## 2014-08-22 ENCOUNTER — Inpatient Hospital Stay (HOSPITAL_COMMUNITY): Payer: Medicare Other

## 2014-08-22 ENCOUNTER — Encounter (HOSPITAL_COMMUNITY): Payer: Self-pay | Admitting: Nurse Practitioner

## 2014-08-22 ENCOUNTER — Emergency Department (HOSPITAL_COMMUNITY): Payer: Medicare Other

## 2014-08-22 ENCOUNTER — Inpatient Hospital Stay (HOSPITAL_COMMUNITY)
Admission: EM | Admit: 2014-08-22 | Discharge: 2014-09-07 | DRG: 208 | Disposition: E | Payer: Medicare Other | Attending: Pulmonary Disease | Admitting: Pulmonary Disease

## 2014-08-22 DIAGNOSIS — F419 Anxiety disorder, unspecified: Secondary | ICD-10-CM | POA: Diagnosis present

## 2014-08-22 DIAGNOSIS — R0902 Hypoxemia: Secondary | ICD-10-CM

## 2014-08-22 DIAGNOSIS — Z7901 Long term (current) use of anticoagulants: Secondary | ICD-10-CM | POA: Diagnosis not present

## 2014-08-22 DIAGNOSIS — E46 Unspecified protein-calorie malnutrition: Secondary | ICD-10-CM | POA: Diagnosis present

## 2014-08-22 DIAGNOSIS — G9340 Encephalopathy, unspecified: Secondary | ICD-10-CM | POA: Diagnosis present

## 2014-08-22 DIAGNOSIS — I2510 Atherosclerotic heart disease of native coronary artery without angina pectoris: Secondary | ICD-10-CM | POA: Diagnosis present

## 2014-08-22 DIAGNOSIS — Z681 Body mass index (BMI) 19 or less, adult: Secondary | ICD-10-CM

## 2014-08-22 DIAGNOSIS — Z515 Encounter for palliative care: Secondary | ICD-10-CM | POA: Insufficient documentation

## 2014-08-22 DIAGNOSIS — J9600 Acute respiratory failure, unspecified whether with hypoxia or hypercapnia: Secondary | ICD-10-CM | POA: Diagnosis not present

## 2014-08-22 DIAGNOSIS — G3109 Other frontotemporal dementia: Secondary | ICD-10-CM | POA: Diagnosis present

## 2014-08-22 DIAGNOSIS — F0280 Dementia in other diseases classified elsewhere without behavioral disturbance: Secondary | ICD-10-CM | POA: Diagnosis present

## 2014-08-22 DIAGNOSIS — R0602 Shortness of breath: Secondary | ICD-10-CM | POA: Diagnosis present

## 2014-08-22 DIAGNOSIS — I480 Paroxysmal atrial fibrillation: Secondary | ICD-10-CM | POA: Diagnosis present

## 2014-08-22 DIAGNOSIS — E876 Hypokalemia: Secondary | ICD-10-CM | POA: Diagnosis not present

## 2014-08-22 DIAGNOSIS — R6521 Severe sepsis with septic shock: Secondary | ICD-10-CM

## 2014-08-22 DIAGNOSIS — Z79899 Other long term (current) drug therapy: Secondary | ICD-10-CM

## 2014-08-22 DIAGNOSIS — J9602 Acute respiratory failure with hypercapnia: Secondary | ICD-10-CM

## 2014-08-22 DIAGNOSIS — E872 Acidosis: Secondary | ICD-10-CM | POA: Diagnosis present

## 2014-08-22 DIAGNOSIS — K219 Gastro-esophageal reflux disease without esophagitis: Secondary | ICD-10-CM | POA: Diagnosis present

## 2014-08-22 DIAGNOSIS — Z66 Do not resuscitate: Secondary | ICD-10-CM | POA: Diagnosis not present

## 2014-08-22 DIAGNOSIS — R1310 Dysphagia, unspecified: Secondary | ICD-10-CM | POA: Diagnosis present

## 2014-08-22 DIAGNOSIS — F329 Major depressive disorder, single episode, unspecified: Secondary | ICD-10-CM | POA: Diagnosis present

## 2014-08-22 DIAGNOSIS — A419 Sepsis, unspecified organism: Secondary | ICD-10-CM | POA: Diagnosis not present

## 2014-08-22 DIAGNOSIS — I4892 Unspecified atrial flutter: Secondary | ICD-10-CM | POA: Diagnosis present

## 2014-08-22 DIAGNOSIS — J9601 Acute respiratory failure with hypoxia: Secondary | ICD-10-CM

## 2014-08-22 DIAGNOSIS — I959 Hypotension, unspecified: Secondary | ICD-10-CM | POA: Diagnosis present

## 2014-08-22 DIAGNOSIS — I10 Essential (primary) hypertension: Secondary | ICD-10-CM | POA: Diagnosis present

## 2014-08-22 DIAGNOSIS — E785 Hyperlipidemia, unspecified: Secondary | ICD-10-CM | POA: Diagnosis present

## 2014-08-22 DIAGNOSIS — J9621 Acute and chronic respiratory failure with hypoxia: Secondary | ICD-10-CM | POA: Diagnosis present

## 2014-08-22 DIAGNOSIS — T17908A Unspecified foreign body in respiratory tract, part unspecified causing other injury, initial encounter: Secondary | ICD-10-CM

## 2014-08-22 DIAGNOSIS — R4182 Altered mental status, unspecified: Secondary | ICD-10-CM

## 2014-08-22 DIAGNOSIS — R400 Somnolence: Secondary | ICD-10-CM

## 2014-08-22 DIAGNOSIS — J690 Pneumonitis due to inhalation of food and vomit: Secondary | ICD-10-CM | POA: Diagnosis present

## 2014-08-22 DIAGNOSIS — R739 Hyperglycemia, unspecified: Secondary | ICD-10-CM | POA: Diagnosis present

## 2014-08-22 DIAGNOSIS — G1221 Amyotrophic lateral sclerosis: Secondary | ICD-10-CM | POA: Diagnosis present

## 2014-08-22 DIAGNOSIS — J9622 Acute and chronic respiratory failure with hypercapnia: Secondary | ICD-10-CM | POA: Diagnosis present

## 2014-08-22 DIAGNOSIS — Z9289 Personal history of other medical treatment: Secondary | ICD-10-CM

## 2014-08-22 DIAGNOSIS — Z978 Presence of other specified devices: Secondary | ICD-10-CM

## 2014-08-22 DIAGNOSIS — I4891 Unspecified atrial fibrillation: Secondary | ICD-10-CM | POA: Diagnosis not present

## 2014-08-22 DIAGNOSIS — T17998D Other foreign object in respiratory tract, part unspecified causing other injury, subsequent encounter: Secondary | ICD-10-CM | POA: Diagnosis not present

## 2014-08-22 DIAGNOSIS — R404 Transient alteration of awareness: Secondary | ICD-10-CM | POA: Diagnosis not present

## 2014-08-22 HISTORY — DX: Unspecified dementia, unspecified severity, without behavioral disturbance, psychotic disturbance, mood disturbance, and anxiety: F03.90

## 2014-08-22 HISTORY — DX: Amyotrophic lateral sclerosis: G12.21

## 2014-08-22 LAB — POCT I-STAT 3, ART BLOOD GAS (G3+)
Acid-Base Excess: 11 mmol/L — ABNORMAL HIGH (ref 0.0–2.0)
Bicarbonate: 35.5 mEq/L — ABNORMAL HIGH (ref 20.0–24.0)
O2 Saturation: 100 %
PO2 ART: 212 mmHg — AB (ref 80.0–100.0)
TCO2: 37 mmol/L (ref 0–100)
pCO2 arterial: 42.8 mmHg (ref 35.0–45.0)
pH, Arterial: 7.527 — ABNORMAL HIGH (ref 7.350–7.450)

## 2014-08-22 LAB — BLOOD GAS, ARTERIAL
ACID-BASE EXCESS: 11.7 mmol/L — AB (ref 0.0–2.0)
Bicarbonate: 40.5 mEq/L — ABNORMAL HIGH (ref 20.0–24.0)
DRAWN BY: 244861
FIO2: 1 %
O2 Saturation: 98.4 %
Patient temperature: 100
TCO2: 44.2 mmol/L (ref 0–100)
pH, Arterial: 7.147 — CL (ref 7.350–7.450)
pO2, Arterial: 223 mmHg — ABNORMAL HIGH (ref 80.0–100.0)

## 2014-08-22 LAB — COMPREHENSIVE METABOLIC PANEL
ALBUMIN: 3.2 g/dL — AB (ref 3.5–5.0)
ALK PHOS: 66 U/L (ref 38–126)
ALT: 34 U/L (ref 14–54)
AST: 28 U/L (ref 15–41)
Anion gap: 6 (ref 5–15)
BUN: 22 mg/dL — ABNORMAL HIGH (ref 6–20)
CO2: 39 mmol/L — ABNORMAL HIGH (ref 22–32)
Calcium: 9.4 mg/dL (ref 8.9–10.3)
Chloride: 96 mmol/L — ABNORMAL LOW (ref 101–111)
Creatinine, Ser: 0.78 mg/dL (ref 0.44–1.00)
Glucose, Bld: 144 mg/dL — ABNORMAL HIGH (ref 65–99)
POTASSIUM: 4.6 mmol/L (ref 3.5–5.1)
Sodium: 141 mmol/L (ref 135–145)
TOTAL PROTEIN: 6 g/dL — AB (ref 6.5–8.1)
Total Bilirubin: 0.3 mg/dL (ref 0.3–1.2)

## 2014-08-22 LAB — URINALYSIS, ROUTINE W REFLEX MICROSCOPIC
Bilirubin Urine: NEGATIVE
Glucose, UA: NEGATIVE mg/dL
Ketones, ur: NEGATIVE mg/dL
Leukocytes, UA: NEGATIVE
Nitrite: NEGATIVE
Protein, ur: 30 mg/dL — AB
SPECIFIC GRAVITY, URINE: 1.024 (ref 1.005–1.030)
Urobilinogen, UA: 0.2 mg/dL (ref 0.0–1.0)
pH: 6 (ref 5.0–8.0)

## 2014-08-22 LAB — CBC
HCT: 49.8 % — ABNORMAL HIGH (ref 36.0–46.0)
Hemoglobin: 15 g/dL (ref 12.0–15.0)
MCH: 29.1 pg (ref 26.0–34.0)
MCHC: 30.1 g/dL (ref 30.0–36.0)
MCV: 96.7 fL (ref 78.0–100.0)
Platelets: 287 10*3/uL (ref 150–400)
RBC: 5.15 MIL/uL — ABNORMAL HIGH (ref 3.87–5.11)
RDW: 15.3 % (ref 11.5–15.5)
WBC: 14.6 10*3/uL — ABNORMAL HIGH (ref 4.0–10.5)

## 2014-08-22 LAB — URINE MICROSCOPIC-ADD ON

## 2014-08-22 LAB — I-STAT CG4 LACTIC ACID, ED
LACTIC ACID, VENOUS: 1.52 mmol/L (ref 0.5–2.0)
Lactic Acid, Venous: 1.28 mmol/L (ref 0.5–2.0)

## 2014-08-22 LAB — STREP PNEUMONIAE URINARY ANTIGEN: Strep Pneumo Urinary Antigen: NEGATIVE

## 2014-08-22 LAB — CBG MONITORING, ED: Glucose-Capillary: 131 mg/dL — ABNORMAL HIGH (ref 65–99)

## 2014-08-22 LAB — MRSA PCR SCREENING: MRSA by PCR: NEGATIVE

## 2014-08-22 MED ORDER — SODIUM CHLORIDE 0.9 % IV SOLN
INTRAVENOUS | Status: DC
  Administered 2014-08-22: 23:00:00 via INTRAVENOUS

## 2014-08-22 MED ORDER — METHYLPREDNISOLONE SODIUM SUCC 40 MG IJ SOLR: 40.0000 mg | Freq: Four times a day (QID) | INTRAMUSCULAR | Status: DC

## 2014-08-22 MED ORDER — IPRATROPIUM-ALBUTEROL 0.5-2.5 (3) MG/3ML IN SOLN: 3.0000 mL | RESPIRATORY_TRACT | Status: DC | PRN

## 2014-08-22 MED ORDER — SODIUM CHLORIDE 0.9 % IV SOLN
INTRAVENOUS | Status: DC
  Administered 2014-08-22: 15:00:00 via INTRAVENOUS

## 2014-08-22 MED ORDER — MIDAZOLAM HCL 2 MG/2ML IJ SOLN
4.0000 mg | Freq: Once | INTRAMUSCULAR | Status: DC
  Filled 2014-08-22: qty 4

## 2014-08-22 MED ORDER — SODIUM CHLORIDE 0.9 % IV BOLUS (SEPSIS)
500.0000 mL | Freq: Once | INTRAVENOUS | Status: AC
  Administered 2014-08-22: 500 mL via INTRAVENOUS

## 2014-08-22 MED ORDER — VANCOMYCIN HCL IN DEXTROSE 1-5 GM/200ML-% IV SOLN: 1000.0000 mg | INTRAVENOUS | Status: DC

## 2014-08-22 MED ORDER — FENTANYL CITRATE (PF) 100 MCG/2ML IJ SOLN
200.0000 ug | Freq: Once | INTRAMUSCULAR | Status: DC
  Filled 2014-08-22: qty 4

## 2014-08-22 MED ORDER — MIDAZOLAM HCL 5 MG/ML IJ SOLN
1.0000 mg/h | INTRAMUSCULAR | Status: DC
  Administered 2014-08-22: 1 mg/h via INTRAVENOUS
  Administered 2014-08-23: 7 mg/h via INTRAVENOUS
  Administered 2014-08-23: 2 mg/h via INTRAVENOUS
  Administered 2014-08-24: 7 mg/h via INTRAVENOUS
  Filled 2014-08-22 (×5): qty 10

## 2014-08-22 MED ORDER — HEPARIN SODIUM (PORCINE) 5000 UNIT/ML IJ SOLN
5000.0000 [IU] | Freq: Three times a day (TID) | INTRAMUSCULAR | Status: DC
  Administered 2014-08-23 – 2014-08-24 (×5): 5000 [IU] via SUBCUTANEOUS
  Filled 2014-08-22 (×9): qty 1

## 2014-08-22 MED ORDER — DEXTROSE 5 % IV SOLN
2.0000 g | INTRAVENOUS | Status: AC
  Administered 2014-08-22: 2 g via INTRAVENOUS
  Filled 2014-08-22: qty 2

## 2014-08-22 MED ORDER — MIDAZOLAM HCL 2 MG/2ML IJ SOLN
1.0000 mg | INTRAMUSCULAR | Status: DC | PRN
  Administered 2014-08-22 – 2014-08-24 (×4): 1 mg via INTRAVENOUS
  Filled 2014-08-22 (×5): qty 2

## 2014-08-22 MED ORDER — SODIUM CHLORIDE 0.9 % IV BOLUS (SEPSIS)
1000.0000 mL | Freq: Once | INTRAVENOUS | Status: AC
  Administered 2014-08-22: 1000 mL via INTRAVENOUS

## 2014-08-22 MED ORDER — SODIUM CHLORIDE 0.9 % IV BOLUS (SEPSIS)
500.0000 mL | INTRAVENOUS | Status: DC
  Administered 2014-08-22: 500 mL via INTRAVENOUS

## 2014-08-22 MED ORDER — INSULIN ASPART 100 UNIT/ML ~~LOC~~ SOLN
0.0000 [IU] | SUBCUTANEOUS | Status: DC
  Administered 2014-08-22: 15 [IU] via SUBCUTANEOUS

## 2014-08-22 MED ORDER — MIDAZOLAM HCL 2 MG/2ML IJ SOLN
1.0000 mg | Freq: Once | INTRAMUSCULAR | Status: AC
  Administered 2014-08-22: 1 mg via INTRAVENOUS
  Filled 2014-08-22: qty 2

## 2014-08-22 MED ORDER — DEXTROSE 5 % IV SOLN: 500.0000 mg | INTRAVENOUS | Status: DC

## 2014-08-22 MED ORDER — DEXTROSE 5 % IV SOLN
1.0000 g | Freq: Two times a day (BID) | INTRAVENOUS | Status: DC
  Filled 2014-08-22: qty 1

## 2014-08-22 MED ORDER — VITAL HIGH PROTEIN PO LIQD
1000.0000 mL | ORAL | Status: DC
  Administered 2014-08-23 – 2014-08-24 (×4): 1000 mL
  Filled 2014-08-22 (×4): qty 1000

## 2014-08-22 MED ORDER — VANCOMYCIN HCL IN DEXTROSE 1-5 GM/200ML-% IV SOLN
1000.0000 mg | Freq: Once | INTRAVENOUS | Status: AC
  Administered 2014-08-22: 1000 mg via INTRAVENOUS
  Filled 2014-08-22: qty 200

## 2014-08-22 MED ORDER — IPRATROPIUM-ALBUTEROL 0.5-2.5 (3) MG/3ML IN SOLN: 3.0000 mL | Freq: Four times a day (QID) | RESPIRATORY_TRACT | Status: DC

## 2014-08-22 MED ORDER — DEXTROSE 5 % IV SOLN: 1.0000 g | INTRAVENOUS | Status: DC

## 2014-08-22 MED ORDER — VANCOMYCIN HCL IN DEXTROSE 1-5 GM/200ML-% IV SOLN
1000.0000 mg | INTRAVENOUS | Status: DC
  Filled 2014-08-22: qty 200

## 2014-08-22 MED ORDER — FENTANYL CITRATE (PF) 100 MCG/2ML IJ SOLN
50.0000 ug | INTRAMUSCULAR | Status: DC | PRN
  Administered 2014-08-22 – 2014-08-24 (×6): 50 ug via INTRAVENOUS
  Filled 2014-08-22 (×6): qty 2

## 2014-08-22 MED ORDER — CHLORHEXIDINE GLUCONATE 0.12 % MT SOLN
15.0000 mL | Freq: Two times a day (BID) | OROMUCOSAL | Status: DC
  Administered 2014-08-22 – 2014-08-24 (×5): 15 mL via OROMUCOSAL
  Filled 2014-08-22 (×5): qty 15

## 2014-08-22 MED ORDER — ROCURONIUM BROMIDE 50 MG/5ML IV SOLN
INTRAVENOUS | Status: AC
  Filled 2014-08-22: qty 2

## 2014-08-22 MED ORDER — SUCCINYLCHOLINE CHLORIDE 20 MG/ML IJ SOLN
INTRAMUSCULAR | Status: AC
  Filled 2014-08-22: qty 1

## 2014-08-22 MED ORDER — CETYLPYRIDINIUM CHLORIDE 0.05 % MT LIQD
7.0000 mL | Freq: Four times a day (QID) | OROMUCOSAL | Status: DC
  Administered 2014-08-23 – 2014-08-24 (×8): 7 mL via OROMUCOSAL

## 2014-08-22 MED ORDER — HEPARIN SODIUM (PORCINE) 5000 UNIT/ML IJ SOLN: 5000.0000 [IU] | Freq: Three times a day (TID) | INTRAMUSCULAR | Status: DC

## 2014-08-22 MED ORDER — AMIODARONE HCL IN DEXTROSE 360-4.14 MG/200ML-% IV SOLN
60.0000 mg/h | INTRAVENOUS | Status: AC
  Administered 2014-08-22 (×2): 60 mg/h via INTRAVENOUS
  Filled 2014-08-22: qty 200

## 2014-08-22 MED ORDER — AMIODARONE HCL IN DEXTROSE 360-4.14 MG/200ML-% IV SOLN
INTRAVENOUS | Status: AC
Start: 2014-08-22 — End: 2014-08-23
  Filled 2014-08-22: qty 200

## 2014-08-22 MED ORDER — DEXTROSE 5 % IV SOLN
500.0000 mg | INTRAVENOUS | Status: DC
  Administered 2014-08-22 – 2014-08-24 (×3): 500 mg via INTRAVENOUS
  Filled 2014-08-22 (×3): qty 500

## 2014-08-22 MED ORDER — DILTIAZEM 12 MG/ML ORAL SUSPENSION
60.0000 mg | Freq: Four times a day (QID) | ORAL | Status: DC
  Filled 2014-08-22 (×7): qty 6

## 2014-08-22 MED ORDER — AMIODARONE LOAD VIA INFUSION
150.0000 mg | Freq: Once | INTRAVENOUS | Status: AC
  Administered 2014-08-22: 150 mg via INTRAVENOUS
  Filled 2014-08-22: qty 83.34

## 2014-08-22 MED ORDER — FENTANYL CITRATE (PF) 100 MCG/2ML IJ SOLN
50.0000 ug | Freq: Once | INTRAMUSCULAR | Status: AC
  Administered 2014-08-22: 50 ug via INTRAVENOUS

## 2014-08-22 MED ORDER — CEFTRIAXONE SODIUM IN DEXTROSE 20 MG/ML IV SOLN
1.0000 g | INTRAVENOUS | Status: DC
  Administered 2014-08-23 (×2): 1 g via INTRAVENOUS
  Filled 2014-08-22 (×3): qty 50

## 2014-08-22 MED ORDER — ETOMIDATE 2 MG/ML IV SOLN
INTRAVENOUS | Status: AC
  Filled 2014-08-22: qty 20

## 2014-08-22 MED ORDER — SODIUM CHLORIDE 0.9 % IV BOLUS (SEPSIS): 1000.0000 mL | INTRAVENOUS | Status: DC

## 2014-08-22 MED ORDER — AMIODARONE HCL IN DEXTROSE 360-4.14 MG/200ML-% IV SOLN
30.0000 mg/h | INTRAVENOUS | Status: DC
Start: 2014-08-23 — End: 2014-08-25
  Administered 2014-08-23 – 2014-08-24 (×4): 30 mg/h via INTRAVENOUS
  Filled 2014-08-22 (×10): qty 200

## 2014-08-22 MED ORDER — LIDOCAINE HCL (CARDIAC) 20 MG/ML IV SOLN
INTRAVENOUS | Status: AC
  Filled 2014-08-22: qty 5

## 2014-08-22 MED ORDER — PANTOPRAZOLE SODIUM 40 MG PO PACK
40.0000 mg | PACK | ORAL | Status: DC
  Administered 2014-08-22 – 2014-08-23 (×2): 40 mg
  Filled 2014-08-22 (×3): qty 20

## 2014-08-22 NOTE — ED Notes (Signed)
Family states the pt has ALS. She has had increased LOC and appears SOB since last night. She fell out of the bed twice last night and wont respond to the family as she normally would

## 2014-08-22 NOTE — ED Provider Notes (Signed)
Patient seen/examined in the Emergency Department in conjunction with Midlevel Provider Hess Patient presents with altered mental status and respiratory distress Exam : pt is somnolent but arousable.  She is tachypneic and tachycardic Plan: pt will need admission.  Will follow closely    Ripley Fraise, MD 08/11/2014 1322

## 2014-08-22 NOTE — Progress Notes (Signed)
Patient transported from ED to room 2H14 without any complications.

## 2014-08-22 NOTE — Progress Notes (Signed)
   08/11/2014 2000  Vitals  BP (!) 86/46 mmHg  MAP (mmHg) 56  Pulse Rate 90  ECG Heart Rate (!) 109  Resp 16  Oxygen Therapy  SpO2 97 %  Dr. Emmit Alexanders made aware. 1000 ml NS bolus order obtained.

## 2014-08-22 NOTE — Progress Notes (Signed)
[facility] Progress Note Patient Name: MERVE HOTARD DOB: 1933-09-14 MRN: [id number]   Date of Service  09/03/2014  HPI/Events of Note  Hypotension - BP = 86/46.  eICU Interventions  Will bolus with 0.9 NaCl 1 liter IV over 1 hour now.      Intervention Category Major Interventions: Hypotension - evaluation and management  Sommer,Steven Eugene 08/16/2014, 8:05 PM

## 2014-08-22 NOTE — ED Notes (Signed)
Dr Oletta Darter returned page.  Spoke with this RN and Gerald Stabs, RN on 2 H.  Orders provided to [facility], Therapist, sports.

## 2014-08-22 NOTE — ED Notes (Signed)
Critical care paged to notify of pt's alertness and continued low BP.

## 2014-08-22 NOTE — ED Notes (Signed)
Pt placed on bipap  

## 2014-08-22 NOTE — Progress Notes (Signed)
   08/28/2014 2215  Vitals  BP (!) 74/41 mmHg  MAP (mmHg) 50  Pulse Rate (!) 103  ECG Heart Rate (!) 102  Resp 16  Oxygen Therapy  SpO2 100 %  Dr. Emmit Alexanders notified of drop in BP. New order obtained. 1000 ml NS bolus started.

## 2014-08-22 NOTE — Progress Notes (Signed)
[facility] Progress Note Patient Name: [name] DOB: Jun 23, 1933 MRN: [id number]   Date of Service  09/02/2014  HPI/Events of Note  Hypotension and agitation. On mechanical ventilation.   eICU Interventions  Will order: 1. 0.9 NaCl 1 liter IV over 1 hour now.  2. Versed IV infusion. Titrate to RASS = 0 to -1.     Intervention Category Major Interventions: Hypotension - evaluation and management Minor Interventions: Agitation / anxiety - evaluation and management  Sommer,Steven Eugene 09/01/2014, 4:40 PM

## 2014-08-22 NOTE — Progress Notes (Signed)
ANTIBIOTIC CONSULT NOTE - INITIAL  Pharmacy Consult for vancomycin + ceftazidime Indication: rule out pneumonia  Allergies  Allergen Reactions  . Aspirin Anaphylaxis    Patient Measurements: Height: 5\' 7"  (170.2 cm) Weight: 112 lb (50.803 kg) IBW/kg (Calculated) : 61.6 Adjusted Body Weight:   Vital Signs: Temp: 100 F (37.8 C) (07/16 1326) Temp Source: Oral (07/16 1448) BP: 151/87 mmHg (07/16 1448) Pulse Rate: 134 (07/16 1448) Intake/Output from previous day:   Intake/Output from this shift:    Labs:  Recent Labs  08/11/2014 1320  WBC 14.6*  HGB 15.0  PLT 287  CREATININE 0.78   Estimated Creatinine Clearance: 45.7 mL/min (by C-G formula based on Cr of 0.78). No results for input(s): VANCOTROUGH, VANCOPEAK, VANCORANDOM, GENTTROUGH, GENTPEAK, GENTRANDOM, TOBRATROUGH, TOBRAPEAK, TOBRARND, AMIKACINPEAK, AMIKACINTROU, AMIKACIN in the last 72 hours.   Microbiology: No results found for this or any previous visit (from the past 720 hour(s)).  Medical History: Past Medical History  Diagnosis Date  . Hypertension   . Dyslipidemia   . Palpitations   . Depression   . Hyperlipidemia   . GERD (gastroesophageal reflux disease)   . Urinary incontinence   . Hiatal hernia   . Renal cysts, acquired, bilateral   . Cholelithiasis   . Renal angiomyolipoma   . Hemorrhoids   . Tubular adenoma   . Heart murmur   . Abdominal pain   . Coronary artery disease   . PAF (paroxysmal atrial fibrillation)     a.  CHA2DS2VASc = 4-->Eliquis.    Medications:  Anti-infectives    Start     Dose/Rate Route Frequency Ordered Stop   08/23/14 1400  vancomycin (VANCOCIN) IVPB 1000 mg/200 mL premix     1,000 mg 200 mL/hr over 60 Minutes Intravenous Every 24 hours 09/04/2014 1454     08/23/14 0200  cefTAZidime (FORTAZ) 1 g in dextrose 5 % 50 mL IVPB     1 g 100 mL/hr over 30 Minutes Intravenous Every 12 hours 09/02/2014 1454     08/28/2014 1345  vancomycin (VANCOCIN) IVPB 1000 mg/200 mL premix      1,000 mg 200 mL/hr over 60 Minutes Intravenous  Once 08/18/2014 1336     08/10/2014 1345  cefTAZidime (FORTAZ) 2 g in dextrose 5 % 50 mL IVPB     2 g 100 mL/hr over 30 Minutes Intravenous STAT 08/30/2014 1336 08/19/2014 1450     Assessment: 76 yof presented to the ED with AMS and respiratory distress. To start empiric vancomycin + ceftazidime for possible pneumonia and sepsis. Pt with Tmax 100 and WBC 14.6, Scr is WNL and LA is 1.52. First does ordered by EDP.   Vanc 7/16>> Ceftazidime 7/16>>  Goal of Therapy:  Vancomycin trough level 15-20 mcg/ml  Plan:  - Vancomycin 1gm IV Q24H - Ceftazidime 1gm IV Q12H - F/u renal fxn, C&S, clinical status and trough at SS.   Anayia Eugene, Rande Lawman 08/31/2014,2:54 PM

## 2014-08-22 NOTE — Progress Notes (Signed)
Decreased O2 to 50% after ABG results, SATS 95%.

## 2014-08-22 NOTE — H&P (Signed)
PULMONARY / CRITICAL CARE MEDICINE   Name: [name] MRN: [id number] DOB: 1933/09/05    ADMISSION DATE:  09/04/2014  REFERRING MD :  ER  CHIEF COMPLAINT:  Short of breath  INITIAL PRESENTATION:  81 yo female with hx of ALS presented with acute on chronic hypoxic/hypercapnic respiratory failure, PNA, and VDRF.  STUDIES:   SIGNIFICANT EVENTS: 7/16 Admit  HISTORY OF PRESENT ILLNESS:   Hx from chart and family.  She was seen in June at [facility] by neurology.  She was found to have ALS-FTD.  Her family has noticed her having trouble breathing.  She was brought to ER.  She was somnolent.  Her ABG showed respiratory acidosis.  She was tried on BiPAP.  She did not improve.  Family decided to proceed with intubation.   PAST MEDICAL HISTORY :   has a past medical history of Hypertension; Dyslipidemia; Palpitations; Depression; Hyperlipidemia; GERD (gastroesophageal reflux disease); Urinary incontinence; Hiatal hernia; Renal cysts, acquired, bilateral; Cholelithiasis; Renal angiomyolipoma; Hemorrhoids; Tubular adenoma; Heart murmur; Abdominal pain; Coronary artery disease; PAF (paroxysmal atrial fibrillation); ALS (amyotrophic lateral sclerosis); and Dementia.  has past surgical history that includes Eye surgery; Appendectomy (10/25/10); and Cholecystectomy (10/25/10). Prior to Admission medications   Medication Sig Start Date End Date Taking? Authorizing Provider  apixaban (ELIQUIS) 5 MG TABS tablet Take 5 mg by mouth 2 (two) times daily.    Historical Provider, MD  Cholecalciferol (VITAMIN D3) 2000 UNITS capsule Take 2,000 Units by mouth daily.    Historical Provider, MD  Cyanocobalamin (VITAMIN B-12 PO) Take 1 tablet by mouth daily.    Historical Provider, MD  diltiazem (CARDIZEM CD) 240 MG 24 hr capsule Take 1 capsule (240 mg total) by mouth daily. 07/23/14   Fay Records, MD  feeding supplement, ENSURE ENLIVE, (ENSURE ENLIVE) LIQD Take 237 mLs by mouth 3 (three) times daily  between meals. Available over the counter 06/19/14   Ripudeep K Rai, MD  gabapentin (NEURONTIN) 300 MG capsule Take 300 mg by mouth 3 (three) times daily. 05/27/14   Historical Provider, MD  guaiFENesin-dextromethorphan (ROBITUSSIN DM) 100-10 MG/5ML syrup Take 5 mLs by mouth every 4 (four) hours as needed for cough. Patient not taking: Reported on 07/08/2014 06/19/14   Ripudeep Krystal Eaton, MD  LORazepam (ATIVAN) 0.5 MG tablet Take 0.5 mg by mouth every 8 (eight) hours.    Historical Provider, MD  Maltodextrin-Xanthan Gum ([place] THICKENUP CLEAR) POWD NECTAR THICK  Use as directed 06/19/14   Ripudeep Krystal Eaton, MD  metoCLOPramide (REGLAN) 5 MG tablet Take 5 mg by mouth 4 (four) times daily -  before meals and at bedtime. 05/25/14   Historical Provider, MD  mirtazapine (REMERON) 30 MG tablet Take 1 tablet (30 mg total) by mouth at bedtime. 08/05/14   Dorothyann Peng, NP  Multiple Vitamin (MULTIVITAMIN WITH MINERALS) TABS tablet Take 1 tablet by mouth daily.    Historical Provider, MD  traZODone (DESYREL) 50 MG tablet Take 25 mg by mouth at bedtime. For sleep 06/03/14   Historical Provider, MD   Allergies  Allergen Reactions  . Aspirin Anaphylaxis    FAMILY HISTORY:  indicated that her mother is deceased. She indicated that her father is deceased. She indicated that all of her three brothers are alive.  SOCIAL HISTORY:  reports that she has never smoked. She has never used smokeless tobacco. She reports that she does not drink alcohol or use illicit drugs.  REVIEW OF SYSTEMS:   Unable to obtain.  SUBJECTIVE:  VITAL SIGNS: Temp:  [100 F (37.8 C)] 100 F (37.8 C) (07/16 1326) Pulse Rate:  [25-150] 25 (07/16 1606) Resp:  [16-35] 18 (07/16 1606) BP: (91-157)/(58-88) 91/59 mmHg (07/16 1606) SpO2:  [90 %-99 %] 95 % (07/16 1606) FiO2 (%):  [100 %] 100 % (07/16 1531) Weight:  [112 lb (50.803 kg)] 112 lb (50.803 kg) (07/16 1324) VENTILATOR SETTINGS: Vent Mode:  [-] PRVC FiO2 (%):  [100 %] 100 % Set  Rate:  [16 bmp] 16 bmp Vt Set:  [500 mL] 500 mL PEEP:  [5 cmH20] 5 cmH20 Plateau Pressure:  [16 cmH20] 16 cmH20 INTAKE / OUTPUT: No intake or output data in the 24 hours ending 08/16/2014 1617  PHYSICAL EXAMINATION: General: ill appearing Neuro:  comatose HEENT:  No oral lesions Cardiovascular:  Irregular, tachycardia Lungs:  Minimal air movements, paradoxical breathing pattern Abdomen:  Soft, non tender, decreased bowel sounds Musculoskeletal:  No edema Skin:  No rashes  LABS:  CBC  Recent Labs Lab 08/10/2014 1320  WBC 14.6*  HGB 15.0  HCT 49.8*  PLT 287   Coag's No results for input(s): APTT, INR in the last 168 hours. BMET  Recent Labs Lab 09/01/2014 1320  NA 141  K 4.6  CL 96*  CO2 39*  BUN 22*  CREATININE 0.78  GLUCOSE 144*   Electrolytes  Recent Labs Lab 08/27/2014 1320  CALCIUM 9.4   Sepsis Markers  Recent Labs Lab 08/26/2014 1336  LATICACIDVEN 1.52   ABG  Recent Labs Lab 08/24/2014 1350  PHART 7.147*  PCO2ART ABOVE REPORTABLE RANGE  PO2ART 223.0*   Liver Enzymes  Recent Labs Lab 09/05/2014 1320  AST 28  ALT 34  ALKPHOS 66  BILITOT 0.3  ALBUMIN 3.2*   Cardiac Enzymes No results for input(s): TROPONINI, PROBNP in the last 168 hours. Glucose  Recent Labs Lab 09/06/2014 1322  GLUCAP 131*    Imaging Dg Chest Portable 1 View  08/16/2014   CLINICAL DATA:  Shortness of breath and hypertension. Coronary artery disease.  EXAM: PORTABLE CHEST - 1 VIEW  COMPARISON:  07/08/2014  FINDINGS: Normal heart size. There is airspace consolidation involving the left lower lobe. Atelectasis noted in the right lung base. The visualized bony structures are unremarkable.  IMPRESSION: 1. Left base opacity compatible with pneumonia.   Electronically Signed   By: Kerby Moors M.D.   On: 09/01/2014 14:07     ASSESSMENT / PLAN:  PULMONARY ETT 7/16 >> A: Acute on chronic hypoxic/hypercapnic respiratory failure 2nd to PNA in setting of ALS. P:   Full vent  support F/u CXR, ABG PRN BDs  CARDIOVASCULAR A:  A fib with RVR. Hx of CAD, HTN, HLD. P:  Cardizem per tube Hold outpt eliquis  RENAL A:   No acute issues. P:   Monitor renal fx, urine outpt  GASTROINTESTINAL A:   Protein calorie malnutrition. P:   Tube feeds on vent Protonix for SUP  HEMATOLOGIC A:   Leukocytosis. P:  F/u CBC SQ heparin  INFECTIOUS A:   CAP. P:   Day 1 vancomycin, rocephin, zithromax  Blood 7/16 >> Sputum 7/16 >> Pneumococcal Ag 7/16 >> Legionella Ag 7/16 >>  ENDOCRINE A:   Hyperglycemia. P:   SSI  NEUROLOGIC A:  Acute encephalopathy 2nd to respiratory failure. Hx of ALS, mild dementia, depression. P:   RASS goal: -1 Hold symmetrel, neurontin, ativan, remeron, seroquel, trazodone, rilutek   Updated family at bedside.  Husband wanted to proceed with intubation.  Family still coming to  terms with her diagnosis of ALS.  Will need to have further discussion with family about goals of care.  CC time 40 minutes.  Chesley Mires, MD [facility] Pulmonary/Critical Care 08/12/2014, 4:30 PM Pager:  [phone] After 3pm call: [phone]

## 2014-08-22 NOTE — ED Notes (Addendum)
Pt intubated by Noe Gens, NP.  Color change present.  23 cm at teeth.  Bilateral lung sounds present per Dr Halford Chessman.

## 2014-08-22 NOTE — Progress Notes (Signed)
eLink Physician-Brief Progress Note Patient Name: ELDORIS BEISER DOB: [birth year] MRN: [id number]   Date of Service  08/12/2014  HPI/Events of Note  Hypotension. SBP = 70's.   eICU Interventions  Will bolus with 0.9 NaCl IV over 1 hour now.      Intervention Category Intermediate Interventions: Hypotension - evaluation and management  Jeryl Wilbourn Eugene 08/07/2014, 10:23 PM

## 2014-08-22 NOTE — Procedures (Signed)
Intubation Procedure Note VELEDA MUN [id number] 11/11/1933  Procedure: Intubation Indications: Respiratory insufficiency  Procedure Details Consent: Risks of procedure as well as the alternatives and risks of each were explained to the (patient/caregiver).  Consent for procedure obtained. Time Out: Verified patient identification, verified procedure, site/side was marked, verified correct patient position, special equipment/implants available, medications/allergies/relevent history reviewed, required imaging and test results available.  Performed  Maximum sterile technique was used including gloves, hand hygiene and mask.  MAC and 3    Evaluation Hemodynamic Status: BP stable throughout; O2 sats: stable throughout Patient's Current Condition: stable Complications: No apparent complications Patient did tolerate procedure well. Chest X-ray ordered to verify placement.  CXR: pending.  Chesley Mires, MD [facility] Pulmonary/Critical Care 08/14/2014, 4:32 PM Pager:  [phone] After 3pm call: [phone]

## 2014-08-22 NOTE — ED Provider Notes (Signed)
CSN: [id number]     Arrival date & time 08/12/2014  1249 History   First MD Initiated Contact with Patient 08/24/2014 1308     Chief Complaint  Patient presents with  . Altered Mental Status     (Consider location/radiation/quality/duration/timing/severity/associated sxs/prior Treatment) HPI Comments: 81 year old female presenting with her husband and daughter from home with altered mental status. Over the past 24 hours, she has rapidly declined in her mental status. Husband states the patient appears to be short of breath and gasping for air. 2 months ago, she was admitted to the hospital with pneumonia. Unknown if she's had any fevers. She slept throughout the day yesterday. Level V caveat secondary to AMS.  Patient is a 81 y.o. female presenting with altered mental status. The history is provided by the spouse and a relative. The history is limited by the condition of the patient.  Altered Mental Status   Past Medical History  Diagnosis Date  . Hypertension   . Dyslipidemia   . Palpitations   . Depression   . Hyperlipidemia   . GERD (gastroesophageal reflux disease)   . Urinary incontinence   . Hiatal hernia   . Renal cysts, acquired, bilateral   . Cholelithiasis   . Renal angiomyolipoma   . Hemorrhoids   . Tubular adenoma   . Heart murmur   . Abdominal pain   . Coronary artery disease   . PAF (paroxysmal atrial fibrillation)     a.  CHA2DS2VASc = 4-->Eliquis.   Past Surgical History  Procedure Laterality Date  . Eye surgery    . Appendectomy  10/25/10  . Cholecystectomy  10/25/10   Family History  Problem Relation Age of Onset  . Heart disease Mother   . Depression Mother   . Cancer Mother     colon cancer  . Hypertension Father   . Heart disease Father   . Cancer Father     prostate cancer   History  Substance Use Topics  . Smoking status: Never Smoker   . Smokeless tobacco: Never Used  . Alcohol Use: No   OB History    No data available     Review of  Systems  Unable to perform ROS: Mental status change      Allergies  Aspirin  Home Medications   Prior to Admission medications   Medication Sig Start Date End Date Taking? Authorizing Provider  apixaban (ELIQUIS) 5 MG TABS tablet Take 5 mg by mouth 2 (two) times daily.    Historical Provider, MD  Cholecalciferol (VITAMIN D3) 2000 UNITS capsule Take 2,000 Units by mouth daily.    Historical Provider, MD  Cyanocobalamin (VITAMIN B-12 PO) Take 1 tablet by mouth daily.    Historical Provider, MD  diltiazem (CARDIZEM CD) 240 MG 24 hr capsule Take 1 capsule (240 mg total) by mouth daily. 07/23/14   Fay Records, MD  feeding supplement, ENSURE ENLIVE, (ENSURE ENLIVE) LIQD Take 237 mLs by mouth 3 (three) times daily between meals. Available over the counter 06/19/14   Ripudeep K Rai, MD  gabapentin (NEURONTIN) 300 MG capsule Take 300 mg by mouth 3 (three) times daily. 05/27/14   Historical Provider, MD  guaiFENesin-dextromethorphan (ROBITUSSIN DM) 100-10 MG/5ML syrup Take 5 mLs by mouth every 4 (four) hours as needed for cough. Patient not taking: Reported on 07/08/2014 06/19/14   Ripudeep Krystal Eaton, MD  LORazepam (ATIVAN) 0.5 MG tablet Take 0.5 mg by mouth every 8 (eight) hours.    Historical Provider, MD  Maltodextrin-Xanthan Gum (RESOURCE THICKENUP CLEAR) POWD NECTAR THICK  Use as directed 06/19/14   Ripudeep Krystal Eaton, MD  metoCLOPramide (REGLAN) 5 MG tablet Take 5 mg by mouth 4 (four) times daily -  before meals and at bedtime. 05/25/14   Historical Provider, MD  mirtazapine (REMERON) 30 MG tablet Take 1 tablet (30 mg total) by mouth at bedtime. 08/05/14   Dorothyann Peng, NP  Multiple Vitamin (MULTIVITAMIN WITH MINERALS) TABS tablet Take 1 tablet by mouth daily.    Historical Provider, MD  traZODone (DESYREL) 50 MG tablet Take 25 mg by mouth at bedtime. For sleep 06/03/14   Historical Provider, MD   BP 151/87 mmHg  Pulse 134  Temp(Src) 100 F (37.8 C) (Rectal)  Resp 22  Ht 5\' 7"  (1.702 m)  Wt 112 lb  (50.803 kg)  BMI 17.54 kg/m2  SpO2 99% Physical Exam  Constitutional: She appears well-developed and well-nourished. She appears ill.  HENT:  Head: Normocephalic and atraumatic.  Mouth/Throat: Oropharynx is clear and moist.  Eyes: Pupils are equal, round, and reactive to light.  Neck: Neck supple.  Cardiovascular: An irregularly irregular rhythm present. Tachycardia present.   Pulmonary/Chest: Tachypnea noted. She is in respiratory distress.  Poor air movement.  Abdominal: Normal appearance and bowel sounds are normal. She exhibits no distension. There is no tenderness.  Musculoskeletal: She exhibits no edema.  Neurological:  Somnolent but arousable.  Skin: Skin is warm.    ED Course  Procedures (including critical care time)  CRITICAL CARE Performed by: Lucien Mons   Total critical care time: 40 minutes  Critical care time was exclusive of separately billable procedures and treating other patients.  Critical care was necessary to treat or prevent imminent or life-threatening deterioration.  Critical care was time spent personally by me on the following activities: development of treatment plan with patient and/or surrogate as well as nursing, discussions with consultants, evaluation of patient's response to treatment, examination of patient, obtaining history from patient or surrogate, ordering and performing treatments and interventions, ordering and review of laboratory studies, ordering and review of radiographic studies, pulse oximetry and re-evaluation of patient's condition.  Labs Review Labs Reviewed  COMPREHENSIVE METABOLIC PANEL - Abnormal; Notable for the following:    Chloride 96 (*)    CO2 39 (*)    Glucose, Bld 144 (*)    BUN 22 (*)    Total Protein 6.0 (*)    Albumin 3.2 (*)    All other components within normal limits  CBC - Abnormal; Notable for the following:    WBC 14.6 (*)    RBC 5.15 (*)    HCT 49.8 (*)    All other components within normal limits   BLOOD GAS, ARTERIAL - Abnormal; Notable for the following:    pH, Arterial 7.147 (*)    pO2, Arterial 223.0 (*)    Bicarbonate 40.5 (*)    Acid-Base Excess 11.7 (*)    All other components within normal limits  CBG MONITORING, ED - Abnormal; Notable for the following:    Glucose-Capillary 131 (*)    All other components within normal limits  CULTURE, BLOOD (ROUTINE X 2)  CULTURE, BLOOD (ROUTINE X 2)  URINE CULTURE  CULTURE, RESPIRATORY (NON-EXPECTORATED)  URINALYSIS, ROUTINE W REFLEX MICROSCOPIC (NOT AT [facility])  LEGIONELLA ANTIGEN, URINE  STREP PNEUMONIAE URINARY ANTIGEN  I-STAT CG4 LACTIC ACID, ED    Imaging Review Dg Chest Portable 1 View  08/15/2014   CLINICAL DATA:  Shortness of breath and hypertension.  Coronary artery disease.  EXAM: PORTABLE CHEST - 1 VIEW  COMPARISON:  07/08/2014  FINDINGS: Normal heart size. There is airspace consolidation involving the left lower lobe. Atelectasis noted in the right lung base. The visualized bony structures are unremarkable.  IMPRESSION: 1. Left base opacity compatible with pneumonia.   Electronically Signed   By: Kerby Moors M.D.   On: 08/09/2014 14:07     EKG Interpretation   Date/Time:  Saturday August 22 2014 13:02:11 EDT Ventricular Rate:  145 PR Interval:    QRS Duration: 112 QT Interval:  326 QTC Calculation: 506 R Axis:   91 Text Interpretation:  Atrial fibrillation with rapid ventricular response  Incomplete right bundle branch block Right ventricular hypertrophy with  repolarization abnormality T wave abnormality, consider inferior ischemia  Abnormal ECG Confirmed by Christy Gentles  MD, DONALD ([id number]) on 08/16/2014  1:19:45 PM      MDM   Final diagnoses:  Altered mental status  Acute respiratory failure   Pt placed on NRB on arrival due to O2 sat 70% RA, increasing to 96% with NRB. Code sepsis, IV fluids given, HCAP abx. ABG abnormal with CO2 above reportable range. Pt placed on BiPap. Husband initially stating pt would  not want to be intubated, however does not believe she has a completed advanced directive. PCCM consulted, after PCCM discussion with family, pt will be intubated by PCCM and admitted to ICU.  Discussed with attending Dr. Christy Gentles  who also evaluated patient and agrees with plan of care.  Carman Ching, PA-C 08/08/2014 1529  Ripley Fraise, MD 08/23/14 1006

## 2014-08-22 NOTE — Progress Notes (Signed)
[place] Progress Note Patient Name: [name] DOB: [birth year] MRN: [id number]   Date of Service  08/16/2014  HPI/Events of Note  AFIB/AFlutter with rate = 145.   eICU Interventions  Will order: 1. Amiodarone bolus and IV infusion.      Intervention Category Major Interventions: Arrhythmia - evaluation and management  Libia Fazzini [place] 08/17/2014, 5:01 PM

## 2014-08-22 NOTE — ED Notes (Signed)
Notified Dr Christy Gentles of pt BP. Gave verbal for 500 ml NS bolus.

## 2014-08-23 ENCOUNTER — Inpatient Hospital Stay (HOSPITAL_COMMUNITY): Payer: Medicare Other

## 2014-08-23 DIAGNOSIS — G1221 Amyotrophic lateral sclerosis: Secondary | ICD-10-CM

## 2014-08-23 DIAGNOSIS — J690 Pneumonitis due to inhalation of food and vomit: Principal | ICD-10-CM

## 2014-08-23 DIAGNOSIS — I4891 Unspecified atrial fibrillation: Secondary | ICD-10-CM

## 2014-08-23 DIAGNOSIS — I959 Hypotension, unspecified: Secondary | ICD-10-CM

## 2014-08-23 LAB — GLUCOSE, CAPILLARY
GLUCOSE-CAPILLARY: 155 mg/dL — AB (ref 65–99)
GLUCOSE-CAPILLARY: 112 mg/dL — AB (ref 65–99)
Glucose-Capillary: 39 mg/dL — CL (ref 65–99)
Glucose-Capillary: 57 mg/dL — ABNORMAL LOW (ref 65–99)
Glucose-Capillary: 68 mg/dL (ref 65–99)
Glucose-Capillary: 140 mg/dL — ABNORMAL HIGH (ref 65–99)
Glucose-Capillary: 314 mg/dL — ABNORMAL HIGH (ref 65–99)
Glucose-Capillary: 114 mg/dL — ABNORMAL HIGH (ref 65–99)
Glucose-Capillary: 119 mg/dL — ABNORMAL HIGH (ref 65–99)
Glucose-Capillary: 114 mg/dL — ABNORMAL HIGH (ref 65–99)

## 2014-08-23 LAB — POCT I-STAT 3, ART BLOOD GAS (G3+)
Acid-Base Excess: 7 mmol/L — ABNORMAL HIGH (ref 0.0–2.0)
Bicarbonate: 28.8 mEq/L — ABNORMAL HIGH (ref 20.0–24.0)
O2 Saturation: 99 %
PCO2 ART: 31.5 mmHg — AB (ref 35.0–45.0)
PH ART: 7.571 — AB (ref 7.350–7.450)
Patient temperature: 99.6
TCO2: 30 mmol/L (ref 0–100)
pO2, Arterial: 102 mmHg — ABNORMAL HIGH (ref 80.0–100.0)

## 2014-08-23 LAB — CBC
HEMATOCRIT: 35.9 % — AB (ref 36.0–46.0)
Hemoglobin: 11.5 g/dL — ABNORMAL LOW (ref 12.0–15.0)
MCH: 29 pg (ref 26.0–34.0)
MCHC: 32 g/dL (ref 30.0–36.0)
MCV: 90.7 fL (ref 78.0–100.0)
Platelets: 192 10*3/uL (ref 150–400)
RBC: 3.96 MIL/uL (ref 3.87–5.11)
RDW: 15.3 % (ref 11.5–15.5)
WBC: 10.5 10*3/uL (ref 4.0–10.5)

## 2014-08-23 LAB — BASIC METABOLIC PANEL
Anion gap: 6 (ref 5–15)
BUN: 15 mg/dL (ref 6–20)
CO2: 24 mmol/L (ref 22–32)
Calcium: 6.5 mg/dL — ABNORMAL LOW (ref 8.9–10.3)
Chloride: 107 mmol/L (ref 101–111)
Creatinine, Ser: 0.56 mg/dL (ref 0.44–1.00)
GFR calc Af Amer: >60 mL/min (ref >60)
GLUCOSE: 137 mg/dL — AB (ref 65–99)
Potassium: 3.2 mmol/L — ABNORMAL LOW (ref 3.5–5.1)
Sodium: 137 mmol/L (ref 135–145)

## 2014-08-23 LAB — URINE CULTURE: Culture: NO GROWTH

## 2014-08-23 LAB — MAGNESIUM: Magnesium: 1.2 mg/dL — ABNORMAL LOW (ref 1.7–2.4)

## 2014-08-23 MED ORDER — DEXTROSE 50 % IV SOLN: 1.0000 | Freq: Once | INTRAVENOUS | Status: AC

## 2014-08-23 MED ORDER — SODIUM CHLORIDE 0.9 % IJ SOLN: 10.0000 mL | INTRAMUSCULAR | Status: DC | PRN

## 2014-08-23 MED ORDER — DEXTROSE 5 % IV SOLN
30.0000 ug/min | INTRAVENOUS | Status: DC
  Administered 2014-08-23: 30 ug/min via INTRAVENOUS
  Filled 2014-08-23: qty 1

## 2014-08-23 MED ORDER — SODIUM CHLORIDE 0.9 % IJ SOLN
10.0000 mL | Freq: Two times a day (BID) | INTRAMUSCULAR | Status: DC
Start: 2014-08-23 — End: 2014-08-25
  Administered 2014-08-24: 10 mL

## 2014-08-23 MED ORDER — DEXTROSE 50 % IV SOLN
INTRAVENOUS | Status: AC
Start: 2014-08-23 — End: 2014-08-23
  Filled 2014-08-23: qty 50

## 2014-08-23 MED ORDER — DEXTROSE 50 % IV SOLN
1.0000 | Freq: Once | INTRAVENOUS | Status: AC
  Administered 2014-08-23: 50 mL via INTRAVENOUS

## 2014-08-23 MED ORDER — DEXTROSE 50 % IV SOLN
INTRAVENOUS | Status: AC
  Administered 2014-08-23: 50 mL
  Filled 2014-08-23: qty 50

## 2014-08-23 MED ORDER — MAGNESIUM SULFATE 2 GM/50ML IV SOLN
2.0000 g | Freq: Once | INTRAVENOUS | Status: AC
  Administered 2014-08-23: 2 g via INTRAVENOUS
  Filled 2014-08-23: qty 50

## 2014-08-23 MED ORDER — POTASSIUM PHOSPHATES 15 MMOLE/5ML IV SOLN
30.0000 mmol | Freq: Once | INTRAVENOUS | Status: AC
  Administered 2014-08-23: 30 mmol via INTRAVENOUS
  Filled 2014-08-23: qty 10

## 2014-08-23 MED ORDER — DEXTROSE 50 % IV SOLN
INTRAVENOUS | Status: AC
  Filled 2014-08-23: qty 50

## 2014-08-23 MED ORDER — VANCOMYCIN HCL 500 MG IV SOLR
500.0000 mg | INTRAVENOUS | Status: DC
  Administered 2014-08-23 – 2014-08-24 (×2): 500 mg via INTRAVENOUS
  Filled 2014-08-23 (×3): qty 500

## 2014-08-23 NOTE — Progress Notes (Signed)
Placed patient on PSV 10/5 40% RR decreased to 10 BPM Ve decreased to 3.0 L/M, patient became agitated, placed back on full support.

## 2014-08-23 NOTE — Progress Notes (Signed)
ANTIBIOTIC CONSULT NOTE - FOLLOW UP  Pharmacy Consult for vancomycin Indication: pneumonia  Allergies  Allergen Reactions  . Aspirin Anaphylaxis    Patient Measurements: Height: 5\' 7"  (170.2 cm) Weight: 132 lb 15 oz (60.3 kg) IBW/kg (Calculated) : 61.6  Vital Signs: Temp: 100 F (37.8 C) (07/17 0400) Temp Source: Oral (07/17 0400) BP: 119/73 mmHg (07/17 0645) Pulse Rate: 90 (07/17 0645) Intake/Output from previous day: 07/16 0701 - 07/17 0700 In: 7014.3 [I.V.:4451.7; NG/GT:212.7; IV Piggyback:2350] Out: 470 [Urine:470] Intake/Output from this shift:    Labs:  Recent Labs  08/19/2014 1320 08/23/14 0319  WBC 14.6* 10.5  HGB 15.0 11.5*  PLT 287 192  CREATININE 0.78 0.56   Estimated Creatinine Clearance: 54.3 mL/min (by C-G formula based on Cr of 0.56). No results for input(s): VANCOTROUGH, VANCOPEAK, VANCORANDOM, GENTTROUGH, GENTPEAK, GENTRANDOM, TOBRATROUGH, TOBRAPEAK, TOBRARND, AMIKACINPEAK, AMIKACINTROU, AMIKACIN in the last 72 hours.   Microbiology: Recent Results (from the past 720 hour(s))  MRSA PCR Screening     Status: None   Collection Time: 08/26/2014  6:34 PM  Result Value Ref Range Status   MRSA by PCR NEGATIVE NEGATIVE Final    Comment:        The GeneXpert MRSA Assay (FDA approved for NASAL specimens only), is one component of a comprehensive MRSA colonization surveillance program. It is not intended to diagnose MRSA infection nor to guide or monitor treatment for MRSA infections.     Anti-infectives    Start     Dose/Rate Route Frequency Ordered Stop   08/23/14 1400  vancomycin (VANCOCIN) IVPB 1000 mg/200 mL premix  Status:  Discontinued     1,000 mg 200 mL/hr over 60 Minutes Intravenous Every 24 hours 08/17/2014 1454 09/03/2014 1521   08/23/14 1400  vancomycin (VANCOCIN) IVPB 1000 mg/200 mL premix  Status:  Discontinued     1,000 mg 200 mL/hr over 60 Minutes Intravenous Every 24 hours 08/16/2014 1559 08/23/14 0910   08/23/14 1400  vancomycin  (VANCOCIN) 500 mg in sodium chloride 0.9 % 100 mL IVPB     500 mg 100 mL/hr over 60 Minutes Intravenous Every 24 hours 08/23/14 0910     08/23/14 0200  cefTAZidime (FORTAZ) 1 g in dextrose 5 % 50 mL IVPB  Status:  Discontinued     1 g 100 mL/hr over 30 Minutes Intravenous Every 12 hours 08/08/2014 1454 09/03/2014 1521   08/23/14 0000  cefTRIAXone (ROCEPHIN) 1 g in dextrose 5 % 50 mL IVPB - Premix     1 g 100 mL/hr over 30 Minutes Intravenous Every 24 hours 09/01/2014 1650     08/20/2014 1800  azithromycin (ZITHROMAX) 500 mg in dextrose 5 % 250 mL IVPB     500 mg 250 mL/hr over 60 Minutes Intravenous Every 24 hours 08/12/2014 1651     09/04/2014 1530  cefTRIAXone (ROCEPHIN) 1 g in dextrose 5 % 50 mL IVPB  Status:  Discontinued     1 g 100 mL/hr over 30 Minutes Intravenous Every 24 hours 08/12/2014 1521 08/21/2014 1650   08/09/2014 1530  azithromycin (ZITHROMAX) 500 mg in dextrose 5 % 250 mL IVPB  Status:  Discontinued     500 mg 250 mL/hr over 60 Minutes Intravenous Every 24 hours 08/24/2014 1521 08/10/2014 1651   08/14/2014 1345  vancomycin (VANCOCIN) IVPB 1000 mg/200 mL premix     1,000 mg 200 mL/hr over 60 Minutes Intravenous  Once 08/31/2014 1336 08/14/2014 1515   08/10/2014 1345  cefTAZidime (FORTAZ) 2 g in dextrose 5 %  50 mL IVPB     2 g 100 mL/hr over 30 Minutes Intravenous STAT 08/09/2014 1336 08/30/2014 1450      Assessment: 81 yo f admitted with AMS and respiratory distress.  Pharmacy is consulted to dose vancomycin for pneumonia.  Patient was originally scheduled to receive 1,000 mg IV q24h.  Based on renal function and age, will change vancomycin dose to 500 mg IV q24h. Wbc wnl, tmax/24h 100.5, SCr 0.56, CrCl ~ 54 ml/min.   Vanc 7/16>> Ceftriaxone 7/16 >> Azithromycin 7/16 >> Ceftaz 7/16>>7/16  Strep pneumo neg Legionella pending MRSA PCR neg 7/16 BCx: 7/16 UCx: 7/16 resp:  Goal of Therapy:  Vancomycin trough level 15-20 mcg/ml  Plan:  Change vancomycin to 500 mg IV q24h Monitor cultures,  CBC, temperature curve, clinical course F/u LOT  Cassie L. Nicole Kindred, PharmD Clinical Pharmacy Resident Pager: [phone] 08/23/2014 9:19 AM

## 2014-08-23 NOTE — Progress Notes (Signed)
Critical lab value phos less than 1 called to Dr. Nelda Marseille.

## 2014-08-23 NOTE — Progress Notes (Signed)
Peripherally Inserted Central Catheter/Midline Placement  The IV Nurse has discussed with the patient and/or persons authorized to consent for the patient, the purpose of this procedure and the potential benefits and risks involved with this procedure.  The benefits include less needle sticks, lab draws from the catheter and patient may be discharged home with the catheter.  Risks include, but not limited to, infection, bleeding, blood clot (thrombus formation), and puncture of an artery; nerve damage and irregular heat beat.  Alternatives to this procedure were also discussed.  PICC/Midline Placement Documentation  PICC / Midline Double Lumen 69/48/54 Right Basilic 34 cm 0 cm (Active)  Indication for Insertion or Continuance of Line Poor Vasculature-patient has had multiple peripheral attempts or PIVs lasting less than 24 hours 08/23/2014  5:08 PM  Exposed Catheter (cm) 0 cm 08/23/2014  5:08 PM  Site Assessment Clean;Dry;Intact 08/23/2014  5:08 PM  Lumen #1 Status Flushed;Saline locked;Blood return noted 08/23/2014  5:08 PM  Lumen #2 Status Flushed;Saline locked;Blood return noted 08/23/2014  5:08 PM  Dressing Type Transparent 08/23/2014  5:08 PM  Dressing Change Due 08/30/14 08/23/2014  5:08 PM       Gordan Payment 08/23/2014, 5:13 PM

## 2014-08-23 NOTE — Progress Notes (Signed)
[place] Progress Note Patient Name: TANDRA ROSADO DOB: [birth year] MRN: [id number]   Date of Service  08/23/2014  HPI/Events of Note  Hypotensive  eICU Interventions  Neo added.     Intervention Category Major Interventions: Hypotension - evaluation and management  YACOUB,WESAM 08/23/2014, 12:05 AM

## 2014-08-23 NOTE — Progress Notes (Signed)
Utilization Review Completed.[name] T7/17/2016  

## 2014-08-23 NOTE — Progress Notes (Signed)
   08/23/14 0000  Vitals  BP (!) 61/44 mmHg  MAP (mmHg) 49  Dr. Nelda Marseille notified. New order obtained.

## 2014-08-23 NOTE — Progress Notes (Addendum)
Hypoglycemic Event  CBG: 39  Treatment: D50 IV 50 mL, tube feeding started  Symptoms: intubated and sedated patient, decrease in responsiveness  Follow-up CBG: [id number] CBG Result:57 treated with D50 53ml x1  Possible Reasons for Event: patient received 15 units of insulin for CBG 315 @ 2101  Comments/MD notified:Dr. Nelda Marseille  Further follow required, CBG @ 0104 - 68 treated with D50 50 ml x1, then rechecked @ 0139- 155. No further treatment at this time.  Cipriano [place]  Remember to initiate Hypoglycemia Order Set & complete

## 2014-08-23 NOTE — Progress Notes (Signed)
PULMONARY / CRITICAL CARE MEDICINE   Name: [name] MRN: [id number] DOB: 05-22-1933    ADMISSION DATE:  09/03/2014  REFERRING MD :  ER  CHIEF COMPLAINT:  Short of breath  INITIAL PRESENTATION:  81 yo female with hx of ALS presented with acute on chronic hypoxic/hypercapnic respiratory failure, PNA, and VDRF.  STUDIES:   SIGNIFICANT EVENTS: 7/16 Admit, A fib with RVR >> added amiodarone, hypotension >> added phenylephrine 7/17 Palliative care consulted  SUBJECTIVE:  Had low Vt with SBT.  VITAL SIGNS: Temp:  [98.7 F (37.1 C)-100.5 F (38.1 C)] 98.7 F (37.1 C) (07/17 0930) Pulse Rate:  [25-150] 90 (07/17 0645) Resp:  [14-35] 16 (07/17 0645) BP: (61-157)/(35-94) 119/73 mmHg (07/17 0645) SpO2:  [87 %-100 %] 99 % (07/17 0645) FiO2 (%):  [40 %-100 %] 40 % (07/17 0757) Weight:  [112 lb (50.803 kg)-132 lb 15 oz (60.3 kg)] 132 lb 15 oz (60.3 kg) (07/17 0500) VENTILATOR SETTINGS: Vent Mode:  [-] PRVC FiO2 (%):  [40 %-100 %] 40 % Set Rate:  [16 bmp] 16 bmp Vt Set:  [500 mL] 500 mL PEEP:  [5 cmH20] 5 cmH20 Plateau Pressure:  [16 cmH20-19 cmH20] 16 cmH20 INTAKE / OUTPUT:  Intake/Output Summary (Last 24 hours) at 08/23/14 1003 Last data filed at 08/23/14 0647  Gross per 24 hour  Intake 7014.33 ml  Output    470 ml  Net 6544.33 ml    PHYSICAL EXAMINATION: General: ill appearing Neuro: RASS -2 HEENT:  ETT in place Cardiovascular:  Irregular Lungs: b/l crackles Abdomen:  Soft, non tender Musculoskeletal:  No edema Skin:  No rashes  LABS:  CBC  Recent Labs Lab 08/28/2014 1320 08/23/14 0319  WBC 14.6* 10.5  HGB 15.0 11.5*  HCT 49.8* 35.9*  PLT 287 192   BMET  Recent Labs Lab 08/30/2014 1320 08/23/14 0319  NA 141 137  K 4.6 3.2*  CL 96* 107  CO2 39* 24  BUN 22* 15  CREATININE 0.78 0.56  GLUCOSE 144* 137*   Electrolytes  Recent Labs Lab 08/29/2014 1320 08/23/14 0319  CALCIUM 9.4 6.5*  MG  --  1.2*  PHOS  --  <1.0*   Sepsis  Markers  Recent Labs Lab 08/31/2014 1336 08/24/2014 1618  LATICACIDVEN 1.52 1.28   ABG  Recent Labs Lab 08/11/2014 1350 08/11/2014 1737 08/23/14 0322  PHART 7.147* 7.527* 7.571*  PCO2ART ABOVE REPORTABLE RANGE 42.8 31.5*  PO2ART 223.0* 212.0* 102.0*   Liver Enzymes  Recent Labs Lab 08/13/2014 1320  AST 28  ALT 34  ALKPHOS 66  BILITOT 0.3  ALBUMIN 3.2*   Glucose  Recent Labs Lab 08/29/2014 2047 08/23/14 0016 08/23/14 0041 08/23/14 0104 08/23/14 0139 08/23/14 0411  GLUCAP 314* 39* 57* 68 155* 140*    Imaging Dg Chest Port 1 View  08/23/2014   CLINICAL DATA:  Acute respiratory failure  EXAM: PORTABLE CHEST - 1 VIEW  COMPARISON:  08/21/2014  FINDINGS: ET tube tip is above the carina. The nasogastric tube tip is in the stomach. Heart size appears normal. There are bilateral pleural effusions identified. No significant interstitial edema.  IMPRESSION: 1. Stable support apparatus. 2. Persistent bilateral pleural effusions.   Electronically Signed   By: Kerby Moors M.D.   On: 08/23/2014 08:54   Dg Chest Port 1 View  08/15/2014   CLINICAL DATA:  81 year old female with endotracheal tube placement  EXAM: PORTABLE CHEST - 1 VIEW  COMPARISON:  Chest radiograph dated 08/23/2014  FINDINGS: An endotracheal tube is  noted with tip approximately 4 cm above the carina. An enteric tube terminates in the left upper abdomen, likely in the stomach. Single-view of the chest demonstrate minimal bibasilar dependent atelectatic changes. There is mild blunting of the left costophrenic angle which may represent a small pleural effusion. No focal consolidation or pneumothorax. Stable cardiac silhouette. There is degenerative changes of the spine and shoulders.  IMPRESSION: Endotracheal tube above the carina.   Electronically Signed   By: Anner Crete M.D.   On: 08/07/2014 17:41   Dg Chest Portable 1 View  08/17/2014   CLINICAL DATA:  Shortness of breath and hypertension. Coronary artery disease.   EXAM: PORTABLE CHEST - 1 VIEW  COMPARISON:  07/08/2014  FINDINGS: Normal heart size. There is airspace consolidation involving the left lower lobe. Atelectasis noted in the right lung base. The visualized bony structures are unremarkable.  IMPRESSION: 1. Left base opacity compatible with pneumonia.   Electronically Signed   By: Kerby Moors M.D.   On: 08/18/2014 14:07     ASSESSMENT / PLAN:  PULMONARY ETT 7/16 >> A: Acute on chronic hypoxic/hypercapnic respiratory failure 2nd to PNA in setting of ALS. P:   Full vent support F/u CXR PRN BDs  CARDIOVASCULAR A:  A fib with RVR. Hx of CAD, HTN, HLD. P:  Amiodarone D/c cardizem Pressors to keep MAP > 65 Hold outpt eliquis  RENAL A:   Hypokalemia. P:   Monitor renal fx, urine outpt Replace electrolytes as needed  GASTROINTESTINAL A:   Protein calorie malnutrition. P:   Tube feeds on vent Protonix for SUP  HEMATOLOGIC A:   Leukocytosis. P:  F/u CBC SQ heparin  INFECTIOUS A:   CAP. P:   Day 2 vancomycin, rocephin, zithromax  Blood 7/16 >> Sputum 7/16 >> Legionella Ag 7/16 >>  ENDOCRINE A:   Hyperglycemia. P:   SSI  NEUROLOGIC A:  Acute encephalopathy 2nd to respiratory failure. Hx of ALS, mild dementia, depression. P:   RASS goal: -1 Hold symmetrel, neurontin, ativan, remeron, seroquel, trazodone, rilutek  Goals of Care Had extensive d/w pt's family.  They understand that her ALS will eventually progress.  She has told her daughter that she would not want to be on life support.  They are conflicted about whether her current illness is related to progression ALS or a reversible process.  Will get palliative care consult to assist in goals of care discussion.  They would like to continue current therapies at least for now in the hope that she can improve to the point of coming of vent on her own.  CC time 45 minutes.   Chesley Mires, MD [facility] Pulmonary/Critical Care 08/23/2014, 10:03 AM Pager:   [phone] After 3pm call: [phone]

## 2014-08-24 ENCOUNTER — Inpatient Hospital Stay (HOSPITAL_COMMUNITY): Payer: Medicare Other

## 2014-08-24 DIAGNOSIS — R404 Transient alteration of awareness: Secondary | ICD-10-CM

## 2014-08-24 DIAGNOSIS — A419 Sepsis, unspecified organism: Secondary | ICD-10-CM

## 2014-08-24 DIAGNOSIS — Z515 Encounter for palliative care: Secondary | ICD-10-CM

## 2014-08-24 DIAGNOSIS — R6521 Severe sepsis with septic shock: Secondary | ICD-10-CM

## 2014-08-24 DIAGNOSIS — J9602 Acute respiratory failure with hypercapnia: Secondary | ICD-10-CM

## 2014-08-24 DIAGNOSIS — T17998D Other foreign object in respiratory tract, part unspecified causing other injury, subsequent encounter: Secondary | ICD-10-CM

## 2014-08-24 DIAGNOSIS — T17908A Unspecified foreign body in respiratory tract, part unspecified causing other injury, initial encounter: Secondary | ICD-10-CM | POA: Insufficient documentation

## 2014-08-24 DIAGNOSIS — R4182 Altered mental status, unspecified: Secondary | ICD-10-CM

## 2014-08-24 DIAGNOSIS — J690 Pneumonitis due to inhalation of food and vomit: Secondary | ICD-10-CM

## 2014-08-24 DIAGNOSIS — J9601 Acute respiratory failure with hypoxia: Secondary | ICD-10-CM

## 2014-08-24 LAB — CBC
HCT: 37 % (ref 36.0–46.0)
Hemoglobin: 12 g/dL (ref 12.0–15.0)
MCH: 28.6 pg (ref 26.0–34.0)
MCHC: 32.4 g/dL (ref 30.0–36.0)
MCV: 88.1 fL (ref 78.0–100.0)
Platelets: 166 10*3/uL (ref 150–400)
RBC: 4.2 MIL/uL (ref 3.87–5.11)
RDW: 16.2 % — AB (ref 11.5–15.5)
WBC: 8.5 10*3/uL (ref 4.0–10.5)

## 2014-08-24 LAB — BASIC METABOLIC PANEL
ANION GAP: 3 — AB (ref 5–15)
BUN: 16 mg/dL (ref 6–20)
CALCIUM: 7.2 mg/dL — AB (ref 8.9–10.3)
CO2: 30 mmol/L (ref 22–32)
Chloride: 102 mmol/L (ref 101–111)
Creatinine, Ser: 0.54 mg/dL (ref 0.44–1.00)
Glucose, Bld: 121 mg/dL — ABNORMAL HIGH (ref 65–99)
Potassium: 3.5 mmol/L (ref 3.5–5.1)
SODIUM: 135 mmol/L (ref 135–145)

## 2014-08-24 LAB — GLUCOSE, CAPILLARY
GLUCOSE-CAPILLARY: 107 mg/dL — AB (ref 65–99)
GLUCOSE-CAPILLARY: 103 mg/dL — AB (ref 65–99)
Glucose-Capillary: 109 mg/dL — ABNORMAL HIGH (ref 65–99)
Glucose-Capillary: 112 mg/dL — ABNORMAL HIGH (ref 65–99)
Glucose-Capillary: 115 mg/dL — ABNORMAL HIGH (ref 65–99)

## 2014-08-24 LAB — LEGIONELLA ANTIGEN, URINE

## 2014-08-24 LAB — PHOSPHORUS
PHOSPHORUS: 2.4 mg/dL — AB (ref 2.5–4.6)
Phosphorus: <1 mg/dL — CL (ref 2.5–4.6)

## 2014-08-24 LAB — MAGNESIUM: Magnesium: 1.7 mg/dL (ref 1.7–2.4)

## 2014-08-24 MED ORDER — FENTANYL BOLUS VIA INFUSION
25.0000 ug | INTRAVENOUS | Status: DC | PRN
  Filled 2014-08-24: qty 25

## 2014-08-24 MED ORDER — POTASSIUM CHLORIDE 20 MEQ/15ML (10%) PO SOLN
40.0000 meq | Freq: Three times a day (TID) | ORAL | Status: AC
  Administered 2014-08-24 (×2): 40 meq
  Filled 2014-08-24 (×4): qty 30

## 2014-08-24 MED ORDER — SODIUM CHLORIDE 0.9 % IV SOLN
25.0000 ug/h | INTRAVENOUS | Status: DC
  Administered 2014-08-24: 50 ug/h via INTRAVENOUS
  Filled 2014-08-24 (×2): qty 50

## 2014-08-24 MED ORDER — VITAL AF 1.2 CAL PO LIQD
1000.0000 mL | ORAL | Status: DC
  Filled 2014-08-24 (×2): qty 1000

## 2014-08-24 MED ORDER — MIDAZOLAM HCL 2 MG/2ML IJ SOLN
2.0000 mg | Freq: Once | INTRAMUSCULAR | Status: AC
  Administered 2014-08-24: 2 mg via INTRAVENOUS

## 2014-08-24 MED ORDER — MIDAZOLAM HCL 2 MG/2ML IJ SOLN
INTRAMUSCULAR | Status: AC
  Filled 2014-08-24: qty 2

## 2014-08-24 MED ORDER — MIDAZOLAM HCL 2 MG/2ML IJ SOLN
2.0000 mg | INTRAMUSCULAR | Status: DC | PRN
  Administered 2014-08-24: 2 mg via INTRAVENOUS

## 2014-08-24 MED ORDER — MORPHINE BOLUS VIA INFUSION
5.0000 mg | INTRAVENOUS | Status: DC | PRN
  Filled 2014-08-24: qty 20

## 2014-08-24 MED ORDER — MORPHINE SULFATE 25 MG/ML IV SOLN
10.0000 mg/h | INTRAVENOUS | Status: DC
  Administered 2014-08-24: 10 mg/h via INTRAVENOUS
  Filled 2014-08-24: qty 10

## 2014-08-24 MED ORDER — SODIUM CHLORIDE 0.9 % IV BOLUS (SEPSIS)
1000.0000 mL | Freq: Once | INTRAVENOUS | Status: AC
  Administered 2014-08-24: 1000 mL via INTRAVENOUS

## 2014-08-24 MED ORDER — POTASSIUM CHLORIDE 20 MEQ/15ML (10%) PO SOLN
20.0000 meq | ORAL | Status: AC
  Administered 2014-08-24 (×2): 20 meq
  Filled 2014-08-24 (×2): qty 15

## 2014-08-24 MED ORDER — SODIUM CHLORIDE 0.9 % IV BOLUS (SEPSIS): 500.0000 mL | Freq: Once | INTRAVENOUS | Status: DC

## 2014-08-24 MED ORDER — MAGNESIUM SULFATE 2 GM/50ML IV SOLN
2.0000 g | Freq: Once | INTRAVENOUS | Status: AC
  Administered 2014-08-24: 2 g via INTRAVENOUS
  Filled 2014-08-24: qty 50

## 2014-08-24 NOTE — Progress Notes (Signed)
PULMONARY / CRITICAL CARE MEDICINE   Name: [name] MRN: [id number] DOB: 10-20-1933    ADMISSION DATE:  08/27/2014  REFERRING MD :  ER  CHIEF COMPLAINT:  Short of breath  INITIAL PRESENTATION:  81 yo female with hx of ALS presented with acute on chronic hypoxic/hypercapnic respiratory failure, PNA, and VDRF.  STUDIES:   SIGNIFICANT EVENTS: 7/16 Admit, A fib with RVR >> added amiodarone, hypotension >> added phenylephrine 7/17 Palliative care consulted  SUBJECTIVE:  Had low Vt with SBT.  VITAL SIGNS: Temp:  [97.8 F (36.6 C)-99.1 F (37.3 C)] 98.7 F (37.1 C) (07/18 0800) Pulse Rate:  [44-119] 105 (07/18 0900) Resp:  [13-17] 14 (07/18 0900) BP: (84-117)/(51-86) 99/80 mmHg (07/18 0900) SpO2:  [91 %-100 %] 100 % (07/18 0900) FiO2 (%):  [40 %] 40 % (07/18 0957) Weight:  [59.2 kg (130 lb 8.2 oz)] 59.2 kg (130 lb 8.2 oz) (07/18 0500) VENTILATOR SETTINGS: Vent Mode:  [-] PSV;CPAP FiO2 (%):  [40 %] 40 % Set Rate:  [14 bmp-16 bmp] 14 bmp Vt Set:  [500 mL] 500 mL PEEP:  [5 cmH20] 5 cmH20 Pressure Support:  [10 cmH20] 10 cmH20 Plateau Pressure:  [14 cmH20-21 cmH20] 18 cmH20 INTAKE / OUTPUT:  Intake/Output Summary (Last 24 hours) at 08/24/14 1002 Last data filed at 08/24/14 0800  Gross per 24 hour  Intake 3763.4 ml  Output    820 ml  Net 2943.4 ml   PHYSICAL EXAMINATION: General: ill appearing but arousable, weak but following commands. Neuro: RASS 0, moving all ext to command but weaker on the LUE. HEENT:  ETT in place, [place]/AT, PERRL, EOM-I and MMM Cardiovascular:  IRIR, Nl S1/S2, -M/R/G. Lungs: b/l crackles Abdomen:  Soft, non tender, ND and +BS. Musculoskeletal:  No edema and -tenderness. Skin:  No rashes, skin thin.  LABS:  CBC  Recent Labs Lab 08/15/2014 1320 08/23/14 0319 08/24/14 0431  WBC 14.6* 10.5 8.5  HGB 15.0 11.5* 12.0  HCT 49.8* 35.9* 37.0  PLT 287 192 166   BMET  Recent Labs Lab 08/21/2014 1320 08/23/14 0319 08/24/14 0431  NA  141 137 135  K 4.6 3.2* 3.5  CL 96* 107 102  CO2 39* 24 30  BUN 22* 15 16  CREATININE 0.78 0.56 0.54  GLUCOSE 144* 137* 121*   Electrolytes  Recent Labs Lab 08/21/2014 1320 08/23/14 0319 08/24/14 0431  CALCIUM 9.4 6.5* 7.2*  MG  --  1.2* 1.7  PHOS  --  <1.0* 2.4*   Sepsis Markers  Recent Labs Lab 08/18/2014 1336 08/29/2014 1618  LATICACIDVEN 1.52 1.28   ABG  Recent Labs Lab 08/21/2014 1350 09/04/2014 1737 08/23/14 0322  PHART 7.147* 7.527* 7.571*  PCO2ART ABOVE REPORTABLE RANGE 42.8 31.5*  PO2ART 223.0* 212.0* 102.0*   Liver Enzymes  Recent Labs Lab 08/18/2014 1320  AST 28  ALT 34  ALKPHOS 66  BILITOT 0.3  ALBUMIN 3.2*   Glucose  Recent Labs Lab 08/23/14 0932 08/23/14 1321 08/23/14 1817 08/23/14 2030 08/23/14 2357 08/24/14 0735  GLUCAP 119* 114* 112* 114* 109* 112*    Imaging Dg Chest Port 1 View  08/24/2014   CLINICAL DATA:  Aspiration.  EXAM: PORTABLE CHEST - 1 VIEW  COMPARISON:  08/23/2014 .  FINDINGS: Endotracheal tube, NG tube, right PICC line in stable position. Persistent bibasilar pulmonary infiltrates and bilateral pleural effusions. No interim change. No pneumothorax .  IMPRESSION: 1. Lines and tubes in stable position. 2. Persistent bibasilar pulmonary infiltrates and pleural effusions. No interim change.  Electronically Signed   By: Marcello Moores  Register   On: 08/24/2014 07:15   Dg Chest Port 1 View  08/23/2014   CLINICAL DATA:  PICC line placement  EXAM: PORTABLE CHEST - 1 VIEW  COMPARISON:  Portable exam 1715 hours compared to 08/23/2014  FINDINGS: Tip of endotracheal tube projects 3.2 cm above carina.  Nasogastric tube extends into stomach.  New RIGHT arm PICC line with tip projecting over mid SVC.  Borderline enlargement of cardiac silhouette.  Bibasilar effusions and minimal atelectasis.  Upper lungs clear.  IMPRESSION: Tip of RIGHT arm PICC line projects over mid SVC.  Bibasilar effusions and minimal atelectasis.   Electronically Signed   By: Lavonia Dana M.D.   On: 08/23/2014 17:24   ASSESSMENT / PLAN:  PULMONARY ETT 7/16 >> A: Acute on chronic hypoxic/hypercapnic respiratory failure 2nd to PNA in setting of ALS. P:   - Decrease RR to 12. - May begin PS trials if awake enough. - Titrate O2 for sat of 88-92%. - Target CO2 of 50, will adjust vent. - Treat PNA.  CARDIOVASCULAR A:  A fib with RVR. Hx of CAD, HTN, HLD. P:  - Amiodarone drip as ordered. - D/c cardizem due to hypotension. - Pressors to keep MAP > 65, currently neo at 10. - Hold outpt eliquis.  RENAL A:   Hypokalemia. P:   - Monitor renal fx, urine outpt. - Replace electrolytes as needed. - BMET in AM. - Continue IVF resuscitation for now, if off pressors and once TF at goal will d/c, likely in AM. - Will need diureses but not while on pressors.  GASTROINTESTINAL A:   Protein calorie malnutrition. P:   - Tube feeds on vent. - Protonix for SUP. - Known dysphagia, will need to be discussed if off vent.  HEMATOLOGIC A:   Leukocytosis. P:  - F/u CBC. - SQ heparin.  INFECTIOUS A:   CAP. P:   Day 3 vancomycin, rocephin, zithromax  Blood 7/16 >> Sputum 7/16 >> Legionella Ag 7/16 >>neg  ENDOCRINE A:   Hyperglycemia. P:   - SSI. - CBGs.  NEUROLOGIC A:  Acute encephalopathy 2nd to respiratory failure. Hx of ALS, mild dementia, depression. P:   RASS goal: -1 - Hold symmetrel, neurontin, ativan, remeron, seroquel, trazodone. - Restart rilutek for ALS per pharmacy.  Goals of Care Spoke with husband and two daughters with the presence of palliative care extensively.  The patient had made her wishes clear that she did not want life support or aggressive measures.  They are regretful that they had her placed on life support yesterday.  It is unclear whether respiratory failure is strictly due to PNA vs progression of her ALS.  After discussion, they do not want a trach/peg kind of situation.  They wish to treat what is treatable then remove  ETT with no intention of replacing it again.  They have also agreed to DNR status.  Requested that palliative care follow for symptom management and family support.    The patient is critically ill with multiple organ systems failure and requires high complexity decision making for assessment and support, frequent evaluation and titration of therapies, application of advanced monitoring technologies and extensive interpretation of multiple databases.   Critical Care Time devoted to patient care services described in this note is  35  Minutes. This time reflects time of care of this signee Dr Jennet Maduro. This critical care time does not reflect procedure time, or teaching time or supervisory time  of PA/NP/Med student/Med Resident etc but could involve care discussion time.  Rush Farmer, M.D. [facility] Pulmonary/Critical Care Medicine. Pager: [phone]. After hours pager: [phone].

## 2014-08-24 NOTE — Consult Note (Signed)
ASked to see [name]  by Dr. Halford Ward for palliative consult for goals of care and symptom management  Impression: 1. ALS with progressive respiratory failure 2. Acute respiratory event - most likely aaspiration 3. Cardiac- A. Fib  Recommendations: 1. DNR 2. Terminal wean if no signs of improvement re: respiratory function. 3. If she survives - Hospice for end of life care. HPI 81 yo female with hx of ALS presented with acute on chronic hypoxic/hypercapnic respiratory failure, PNA, and VDRF. She was seen in June at [facility] by neurology. She was found to have ALS-FTD. Her family has noticed her having trouble breathing. She was brought to ER. She was somnolent. Her ABG showed respiratory acidosis. She was tried on BiPAP. She did not improve. Family decided to proceed with intubation.  Chart review, including Care Everywhere notes from [facility] neuro, confirming diagnosis of ALS with FrontoTemporalDementia, FTD, and treatment options. Out patient [place] notes - no documentation of ACP.  Per notes - daughter reported that [name] had not wanted advanced Life support.   Per family report she has had a rapid decline since March '16. Over the past 3-4 weeks she has had progressive problems with aspiration and marked difficulty with respirations. She has had rapid, shallow breathing much worse when supine and was just started on home O2.    Scheduled Meds:  antiseptic oral rinse  7 mL Mouth Rinse QID   azithromycin  500 mg Intravenous Q24H   cefTRIAXone (ROCEPHIN)  IV  1 g Intravenous Q24H   chlorhexidine  15 mL Mouth Rinse BID   feeding supplement (VITAL HIGH PROTEIN)  1,000 mL Per Tube Q24H   heparin subcutaneous  5,000 Units Subcutaneous 3 times per day   insulin aspart  0-20 Units Subcutaneous 6 times per day   pantoprazole sodium  40 mg Per Tube Q24H   potassium chloride  20 mEq Per Tube Q4H   sodium chloride  10-40 mL Intracatheter Q12H   vancomycin   500 mg Intravenous Q24H   Continuous Infusions:  sodium chloride 75 mL/hr at 08/24/14 0700   sodium chloride Stopped (08/23/14 2200)   amiodarone 30 mg/hr (08/24/14 0700)   midazolam (VERSED) infusion 3 mg/hr (08/24/14 0830)   phenylephrine (NEO-SYNEPHRINE) Adult infusion Stopped (08/23/14 0647)   PRN Meds:.fentaNYL (SUBLIMAZE) injection, ipratropium-albuterol, midazolam, sodium chloride  Past Medical History  Diagnosis Date   Hypertension    Dyslipidemia    Palpitations    Depression    Hyperlipidemia    GERD (gastroesophageal reflux disease)    Urinary incontinence    Hiatal hernia    Renal cysts, acquired, bilateral    Cholelithiasis    Renal angiomyolipoma    Hemorrhoids    Tubular adenoma    Heart murmur    Abdominal pain    Coronary artery disease    PAF (paroxysmal atrial fibrillation)     a.  CHA2DS2VASc = 4-->[name].   ALS (amyotrophic lateral sclerosis)    Dementia     Past Surgical History  Procedure Laterality Date   Eye surgery     Appendectomy  10/25/10   Cholecystectomy  10/25/10    History   Social History   Marital Status: Married    Spouse Name: N/A   Number of Children: N/A   Years of Education: N/A   Social History Main Topics   Smoking status: Never Smoker    Smokeless tobacco: Never Used   Alcohol Use: No   Drug Use: No  Sexual Activity: Not on file   Other Topics Concern   None   Social History Narrative   Married for 44 years    Six children 5 girls one boy they all live around here. 25 grands and great-grands   Pets: Dog.     Palliative Care Status - DNR, if extubated no reintubation  Palliative Prophylaxis - will order dulcolax supp prn  Palliative Review of Systems: Patient on ventilator. When sedation lifted she became agitated.    PE:  Filed Vitals:   08/24/14 0900  BP: 99/80  Pulse: 105  Temp:   Resp: 14   General: Intubated patient  Physical exam deferred due to CCM  with patient and nursing care  CBC Latest Ref Rng 08/24/2014 08/23/2014 08/26/2014  WBC 4.0 - 10.5 K/uL 8.5 10.5 14.6(H)  Hemoglobin 12.0 - 15.0 g/dL 12.0 11.5(L) 15.0  Hematocrit 36.0 - 46.0 % 37.0 35.9(L) 49.8(H)  Platelets 150 - 400 K/uL 166 192 287    CMP Latest Ref Rng 08/24/2014 08/23/2014 08/18/2014  Glucose 65 - 99 mg/dL 121(H) 137(H) 144(H)  BUN 6 - 20 mg/dL 16 15 22(H)  Creatinine 0.44 - 1.00 mg/dL 0.54 0.56 0.78  Sodium 135 - 145 mmol/L 135 137 141  Potassium 3.5 - 5.1 mmol/L 3.5 3.2(L) 4.6  Chloride 101 - 111 mmol/L 102 107 96(L)  CO2 22 - 32 mmol/L 30 24 39(H)  Calcium 8.9 - 10.3 mg/dL 7.2(L) 6.5(L) 9.4  Total Protein 6.5 - 8.1 g/dL - - 6.0(L)  Total Bilirubin 0.3 - 1.2 mg/dL - - 0.3  Alkaline Phos 38 - 126 U/L - - 66  AST 15 - 41 U/L - - 28  ALT 14 - 54 U/L - - 34   Blood Cx's - NGTD Urine Cx- NGTD  Imaging CXR 7/16 - IMPRESSION: 1. Left base opacity compatible with pneumonia.  CXR 7/17 - IMPRESSION: 1. Stable support apparatus. 2. Persistent bilateral pleural effusions.  Family conf- present Husband and 5 daughters, several grand daughters. Reviewed her ALS diagnosis and progressive decline. Discussed Code status. Prepared family for decision about terminal wean if she does not show improvement. Discussed the long term picture with rapidly progressive ALS. Her husband and the family support her wishes for no further heroics beyond this current event. She is a candidate for hospice should she survive this hospitalization.   Thank you for this consult  Greater than 50% of encounter on family counseling and education in regard to the patient  Time in 09:45 Time out 11:59  Adella Hare, MD, [place] Team [phone]

## 2014-08-24 NOTE — Clinical Documentation Improvement (Signed)
"  Protein calorie malnutrition" documented in current notes as well as height 5\' 7" , weight 112 lb, BMI 17.54.  Please provide the severity of malnutrition (mild, moderate, severe), if known, and document in your future progress notes and discharge summary.  Thank you, Mateo Flow, RN [phone] Clinical Documentation Specialist

## 2014-08-24 NOTE — Progress Notes (Signed)
eLink Physician-Brief Progress Note Patient Name: [name] DOB: 21-Feb-1933 MRN: [id number]   Date of Service  08/24/2014  HPI/Events of Note  Hypotension. BP = 70/47. Fentanyl IV infusion decreased.  eICU Interventions  Will order: 1. 0.9 NaCl 500 mL IV over 30 minutes now.      Intervention Category Intermediate Interventions: Hypotension - evaluation and management  Supreme Rybarczyk Eugene 08/24/2014, 8:19 PM

## 2014-08-24 NOTE — Care Management Important Message (Signed)
Important Message  Patient Details  Name: [name] MRN: [id number] Date of Birth: 07-19-1933   Medicare Important Message Given:  Yes-second notification given    Pricilla Handler 08/24/2014, 2:28 PM

## 2014-08-24 NOTE — Progress Notes (Signed)
Initial Nutrition Assessment  DOCUMENTATION CODES:   Severe malnutrition in context of chronic illness  INTERVENTION:   Initiate Vital AF 1.2 @ 20 ml/hr via OG tube and increase by 10 ml every 4 hours to goal rate of 50 ml/hr.    Tube feeding regimen provides 1440 kcal (102% of needs), 90 grams of protein, and 973 ml of H2O.    NUTRITION DIAGNOSIS:   Malnutrition related to chronic illness as evidenced by severe depletion of muscle mass, 25 percent weight loss in the last year.   GOAL:   Patient will meet greater than or equal to 90% of their needs   MONITOR:   TF tolerance, I & O's, Labs, Vent status  REASON FOR ASSESSMENT:   Consult Enteral/tube feeding initiation and management  ASSESSMENT:   81 yo female with hx of ALS presented with acute on chronic hypoxic/hypercapnic respiratory failure, PNA, and VDRF.  Patient is currently intubated on ventilator support MV: 13.2 L/min Temp (24hrs), Avg:98.6 F (37 C), Min:97.8 F (36.6 C), Max:99.1 F (37.3 C)   Medications reviewed and include: KCl Labs reviewed: cbg's: 109-114, phosphorus low OG tube, tip confirmed via auscultation Nutrition-Focused physical exam completed. Findings are mild/moderate fat depletion, severe muscle depletion, and mild edema.   Per husband pt has lost 45 lb in the last year due to poor appetite. Pt had been on an appetite stimulant (husband could not remember name of it) but it was not helping. She went to the doctor and they wrote her a prescription for remeron but it has not yet been filled. Per husband pt would eat oatmeal for breakfast, lunch was a 1/2 sandwich, dinner was pasta. He was feeding her a high protein supplement 4 times per day. He found this to be the only way he could keep her weight up.   Palliative care consulted  Diet Order:  Diet NPO time specified  Skin:  Reviewed, no issues  Last BM:  PTA  Height:   Ht Readings from Last 1 Encounters:  08/27/2014 5\' 7"   (1.702 m)    Weight:   Wt Readings from Last 1 Encounters:  08/24/14 130 lb 8.2 oz (59.2 kg)    Ideal Body Weight:  61.3 kg  Wt Readings from Last 10 Encounters:  08/24/14 130 lb 8.2 oz (59.2 kg)  08/05/14 116 lb 1.6 oz (52.663 kg)  07/14/14 117 lb (53.071 kg)  07/08/14 116 lb (52.617 kg)  06/29/14 118 lb (53.524 kg)  06/18/14 126 lb 8 oz (57.38 kg)  10/07/13 127 lb 3.3 oz (57.7 kg)  09/18/13 129 lb (58.514 kg)  12/11/12 138 lb (62.596 kg)  03/14/12 146 lb 9.6 oz (66.497 kg)    BMI:  Body mass index is 20.44 kg/(m^2).  Estimated Nutritional Needs:   Kcal:  1408  Protein:  75-100 grams  Fluid:  > 1.5 L/day  EDUCATION NEEDS:   No education needs identified at this time  [place], [place], [place] Pager [phone] After Hours Pager

## 2014-08-24 NOTE — Progress Notes (Signed)
[place] Progress Note Patient Name: [name] DOB: 04/17/1933 MRN: [id number]   Date of Service  08/24/2014  HPI/Events of Note  Agitation. Already on a Fentanyl IV infusion.   eICU Interventions  Increase Versed to 2 mg IV Q hours PRN.     Intervention Category Minor Interventions: Agitation / anxiety - evaluation and management  Lysle Dingwall 08/24/2014, 6:17 PM

## 2014-08-24 NOTE — Progress Notes (Addendum)
[place] Progress Note Patient Name: [name] DOB: 1933/10/19 MRN: [id number]   Date of Service  08/24/2014  HPI/Events of Note  Hypoxia. Sat decreased to 64% and now improved to 100% after recruitment maneuver with ambu bag with PEEP 10.   eICU Interventions  Will order: 1. Portable CXR now. 2. Increase PEEP to 12.  CXR - Appears similar to film from early this AM. Bilateral base infiltrates and effusions. There is really not any great change in the CXR.      Intervention Category Intermediate Interventions: Respiratory distress - evaluation and management  Kayce Chismar Eugene 08/24/2014, 6:58 PM

## 2014-08-24 NOTE — Progress Notes (Signed)
eLink Physician-Brief Progress Note Patient Name: [name] DOB: 1933-06-06 MRN: [id number]   Date of Service  08/24/2014  HPI/Events of Note  Hypotension. BP = 88/64.   eICU Interventions  Will bolus with 0.9 NaCl 1 liter IV over 1 hour now.     Intervention Category Intermediate Interventions: Hypotension - evaluation and management  Eliceo Gladu Eugene 08/24/2014, 5:06 PM

## 2014-08-24 NOTE — Progress Notes (Addendum)
PCCM INTERVAL PROGRESS NOTE  Long discussion with family including her husband and several children regarding [place]. They state that she has been declining rapidly for about 6 weeks and much of this is related to ALS. Her baseline quality of life at this point is not something they, or she, would consider favorable. She has made it clear to them in the past that she would never want to be on life support, especially for and extended period of time. She has been in ICU on broad spectrum ABX for > 48 hours at this point and has had no improvement, and is arguably worsening based on her difficult to control BP. When presented with the options of continuing with her care with the limitations already put in place by family (no pressors/no trach/no PEG), vs full comfort care, they have opted to pursue full comfort care at this time.   BP 116/72 mmHg  Pulse 70  Temp(Src) 98.7 F (37.1 C) (Oral)  Resp 13  Ht 5\' 7"  (1.702 m)  Wt 59.2 kg (130 lb 8.2 oz)  BMI 20.44 kg/m2  SpO2 92%   Plan: Initiate titratable morphine gtt Initiate withdrawal of life sustaining treatment protocol  Additional CC time 30 mins  Georgann Housekeeper, AGACNP-BC [place] Pager [phone] or [phone]  08/24/2014 9:36 PM

## 2014-08-24 NOTE — Progress Notes (Signed)
[facility] ADULT ICU REPLACEMENT PROTOCOL FOR AM LAB REPLACEMENT ONLY  The patient does apply for the [facility] Adult ICU Electrolyte Replacment Protocol based on the criteria listed below:   1. Is GFR >/= 40 ml/min? Yes.    Patient's GFR today is >60 2. Is urine output >/= 0.5 ml/kg/hr for the last 6 hours? Yes.   Patient's UOP is 0.8 ml/kg/hr 3. Is BUN < 60 mg/dL? Yes.    Patient's BUN today is 16 4. Abnormal electrolyte(s): K+3.5  Mg 1.7 5. Ordered repletion with: protocol 6. If a panic level lab has been reported, has the CCM MD in charge been notified? No..   Physician:  E Deterding  Concepcion Living [facility] 08/24/2014 5:26 AM

## 2014-08-25 ENCOUNTER — Ambulatory Visit (HOSPITAL_COMMUNITY): Payer: Medicare Other

## 2014-08-26 ENCOUNTER — Telehealth: Payer: Self-pay

## 2014-08-26 NOTE — Telephone Encounter (Signed)
On 08/26/2014 I received a death certificate from [facility]. The death certificate is for Entombment. The patient is a Economist. The death certificate will be taken to [facility] this afternoon for Doctor Nelda Marseille to sign the death certificate.  Received back 08-28-14 and called for pick-up.

## 2014-08-27 LAB — CULTURE, BLOOD (ROUTINE X 2)
CULTURE: NO GROWTH
CULTURE: NO GROWTH

## 2014-09-01 ENCOUNTER — Ambulatory Visit: Payer: Medicare Other | Admitting: Adult Health

## 2014-09-07 NOTE — Progress Notes (Signed)
Patient terminally extubated without any complications.

## 2014-09-07 NOTE — Procedures (Signed)
Extubation Procedure Note  Patient Details:   Name: [name] DOB: [birth year] MRN: [id number]   Airway Documentation:  Airway 7.5 mm (Active)  Secured at (cm) 23 cm 08/24/2014 11:30 PM  Measured From Lips 08/24/2014 11:30 PM  Secured Location Right 08/24/2014 11:30 PM  Secured By [employer] 08/24/2014 11:30 PM  Tube Holder Repositioned Yes 08/24/2014 11:30 PM  Cuff Pressure (cm H2O) 26 cm H2O 08/24/2014  7:43 PM  Site Condition Dry 08/24/2014 11:30 PM    Evaluation  O2 sats: currently acceptable Complications: No apparent complications Patient did tolerate procedure well. Bilateral Breath Sounds: Clear, Diminished Suctioning: Airway No  Blanchie Serve Sep 10, 2014, 12:11 AM

## 2014-09-07 NOTE — Progress Notes (Signed)
Wasted in sink fentanyl 100 cc and Morphine 150cc. Witnessed by Kai Levins, RN

## 2014-09-07 NOTE — Progress Notes (Signed)
Pt was placed on IV morphine for comfort care around 22:27. Initiated bolus and increased as ordered. Family at bedside . At 00:13 pt was terminally extubated. And at 00:25 pt was pronounced dead by RN's. [name] and [name]. Georgann Housekeeper, NP notified.

## 2014-09-07 DEATH — deceased

## 2014-09-14 ENCOUNTER — Telehealth: Payer: Self-pay | Admitting: Internal Medicine

## 2014-09-14 NOTE — Telephone Encounter (Signed)
New message    [facility] aware pt has passed away [facility] faxed 2 sets of paperwork on August 2nd that need to be filled out by Dr. Harrington Challenger  for compliance while nurses were in home while seeing pt Please call to discuss

## 2014-09-14 NOTE — Telephone Encounter (Signed)
Left detailed message on Lynette with [place] to contact the office back to discuss faxed paperwork sent to our office for Dr Harrington Challenger to fill out.

## 2014-09-16 ENCOUNTER — Telehealth: Payer: Self-pay | Admitting: Internal Medicine

## 2014-09-16 NOTE — Telephone Encounter (Signed)
Paperwork faxed from [facility] for Dr. Harrington Challenger to sign off on. The patient is deceased, but further documents require Dr. Harrington Challenger' signature to cover clinicians while home health services were provided in the home. The request asked that these be signed off and returned by today at 5:00 pm (they were received yesteday). I left a message for [facility] that Dr. Harrington Challenger is out of the office this week and next and we will have her sign them on her return. I asked her to call back if she needs them more urgently than ths,

## 2014-09-17 NOTE — Telephone Encounter (Signed)
Follow up      [facility] found the signed papers by Dr Harrington Challenger.  You do not have to refax the papers.  Please disregard the previous message

## 2014-10-08 NOTE — Discharge Summary (Signed)
NAMENIRALYA, OHANIAN NO.:  [id number]  MEDICAL RECORD NO.:  [id number]  LOCATION:  [id number]                        FACILITY:  [facility]  PHYSICIAN:  Providence Lanius, MD  DATE OF BIRTH:  28-Aug-1933  DATE OF ADMISSION:  08/27/2014 DATE OF DISCHARGE:  2014-09-14                              DISCHARGE SUMMARY   DEATH SUMMARY.  PRIMARY DIAGNOSIS/CAUSE OF DEATH:  Aspiration pneumonia.  SECONDARY DIAGNOSES: 1. Amyotrophic lateral sclerosis. 2. Acute respiratory failure with hypoxemia and hypercarbia. 3. Atrial fibrillation with rapid ventricular response. 4. Hypokalemia. 5. Hypertension. 6. Protein-calorie malnutrition. 7. Leukocytosis. 8. Hyperglycemia. 9. Acute encephalopathy secondary to respiratory failure.  HOSPITAL COURSE:  The patient is a 81 year old female with an extensive past medical history to include ALS.  The patient evidently has been deteriorating for 6 weeks prior to coming to the hospital.  The patient was complaining of shortness of breath when brought into the hospital. Large pneumonia was noted, and the patient developed hypercarbic and hypoxic respiratory failure.  The patient previously had made her wishes clear that she does not want to be on life support; however, when the patient was altered, the family reversed code status and asked that we intubate the patient.  The patient was intubated and moved to the ICU, failed to make any significant progress and PCCM was called to admit. The patient was intubated and after failure to show any progress, we had a conference with the family who informed that they were regretful that they made decision to put the patient on life support and requested comfort measures only.  Morphine was started.  The patient was extubated, but expired shortly thereafter with the family at bedside.     Providence Lanius, MD     [id number]/[id number]  D:  09/16/2014  T:  09/17/2014  Job:  [id number]

## 2015-01-13 ENCOUNTER — Encounter: Payer: Self-pay | Admitting: Cardiology

## 2015-01-13 ENCOUNTER — Other Ambulatory Visit: Payer: Self-pay | Admitting: Cardiology

## 2015-03-29 IMAGING — CR DG LUMBAR SPINE COMPLETE 4+V
5 series · 5 of 5 positions shown · non-contrast
Comparison: CT 07/16/2013

CLINICAL DATA: Mid back pain 3 years.

EXAM:
LUMBAR SPINE - COMPLETE 4+ VIEW

[t l-spine a.p.]
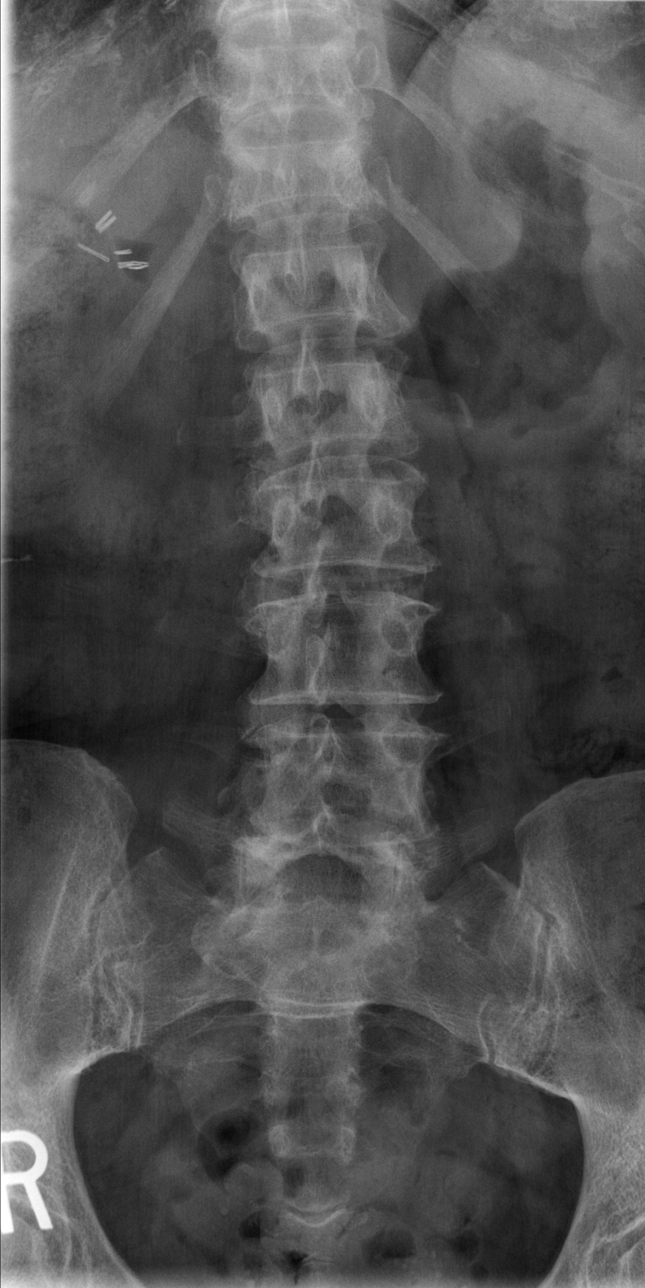

[t l-spine oblique exposure (1 of 2)]
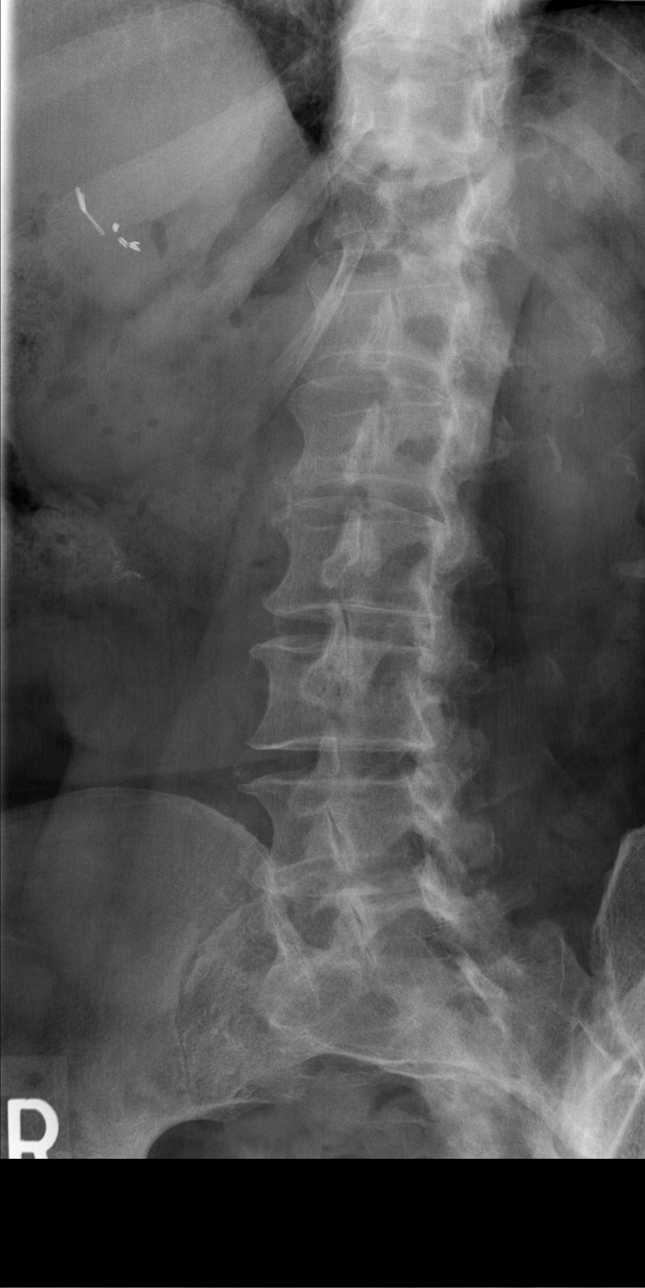

[t l-spine oblique exposure (2 of 2)]
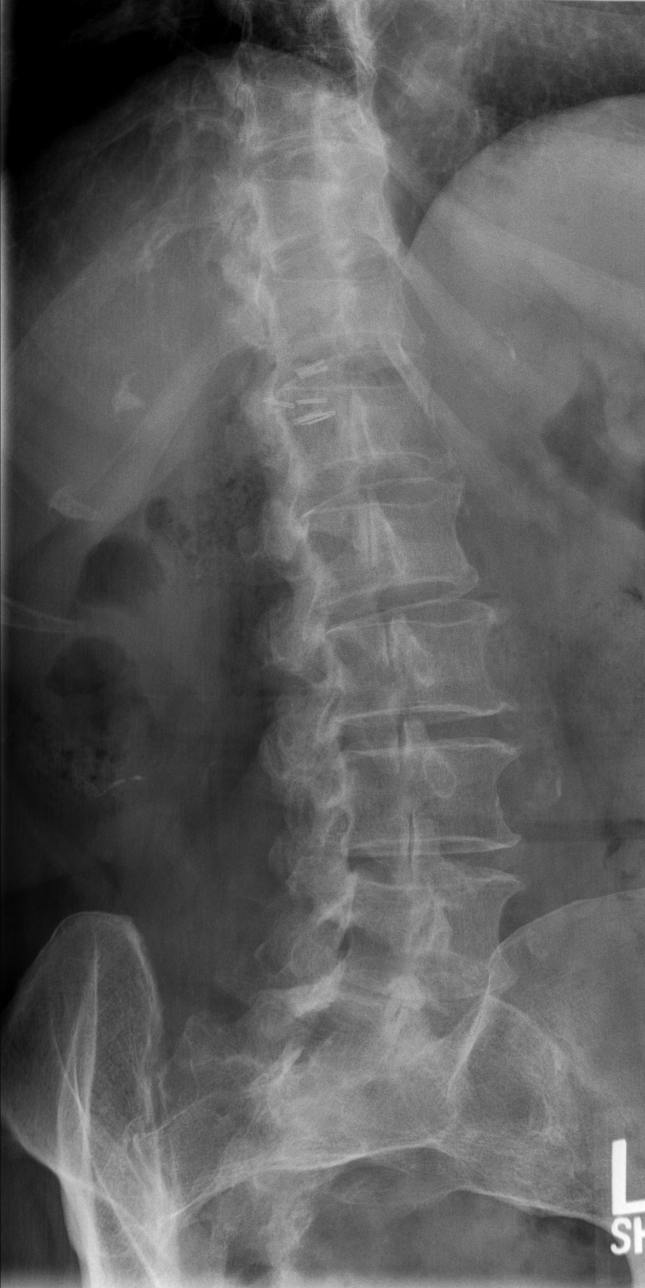

[t l-spine lat]
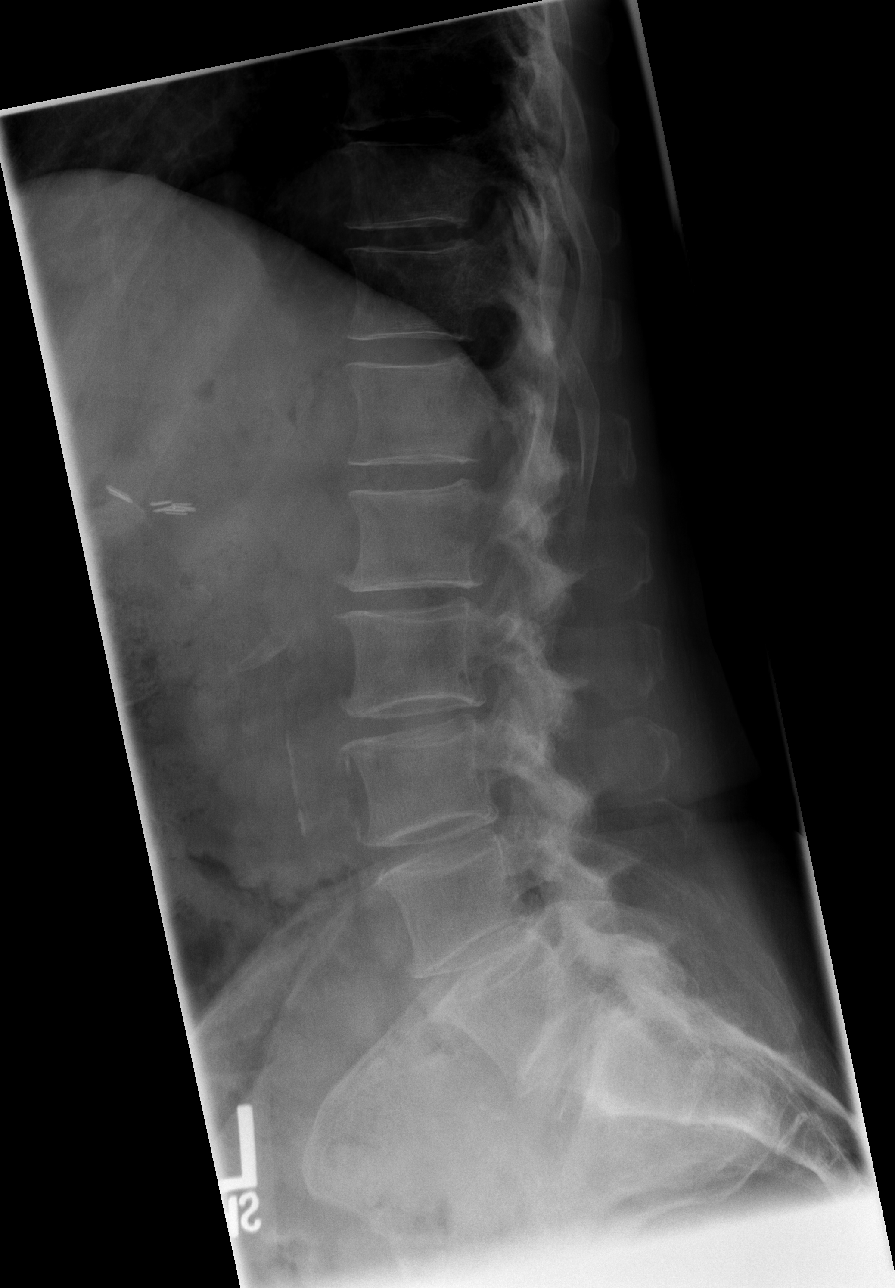

[t l-spine l5-s1 spot]
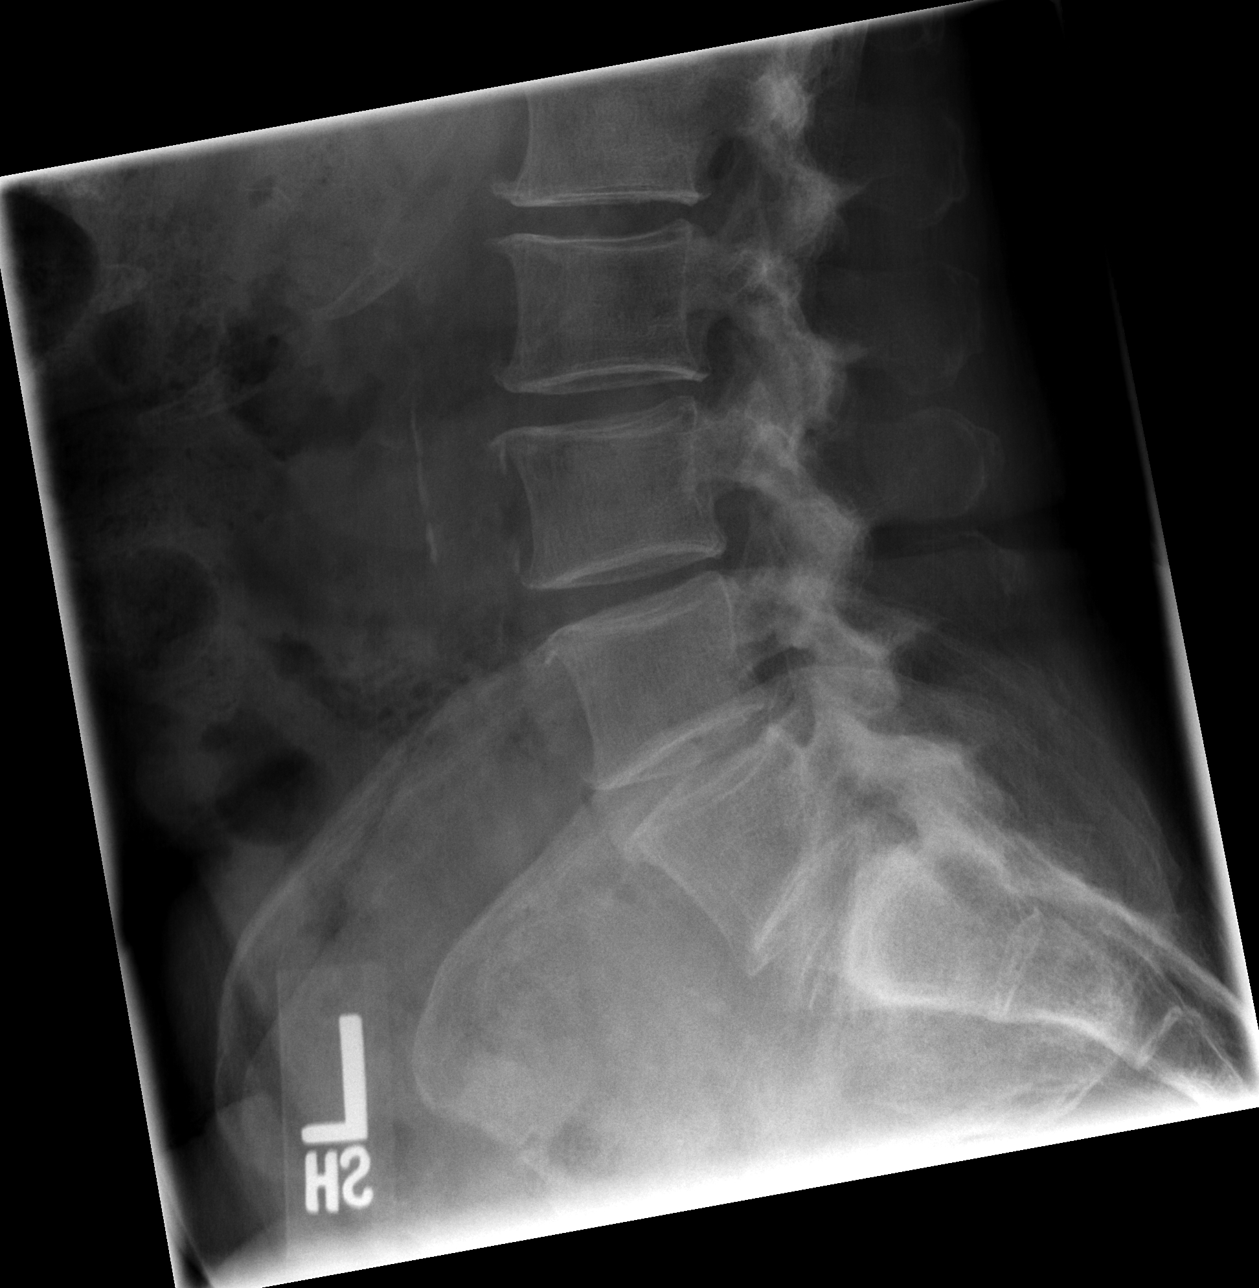

[5 of 5 positions shown; findings below may reference images not displayed]

FINDINGS: There subtle curvature convex to the left unchanged. Vertebral body
heights and disc space heights are maintained. There is mild
spondylosis throughout the lumbar spine to include facet
arthropathy. There is no compression fracture or subluxation. There
is minimal calcified plaque over the distal abdominal aorta. There
are surgical clips over the quadrant.
IMPRESSION: Mild spondylosis of lumbar spine.  No acute findings.

## 2015-12-19 IMAGING — CR DG CHEST 2V
2 series · 2 of 2 positions shown · non-contrast
Comparison: 06/18/2014.

CLINICAL DATA: Subsequent encounter for pneumonia and shortness of
breath.

EXAM:
CHEST  2 VIEW

[view not recorded (1 of 2)]
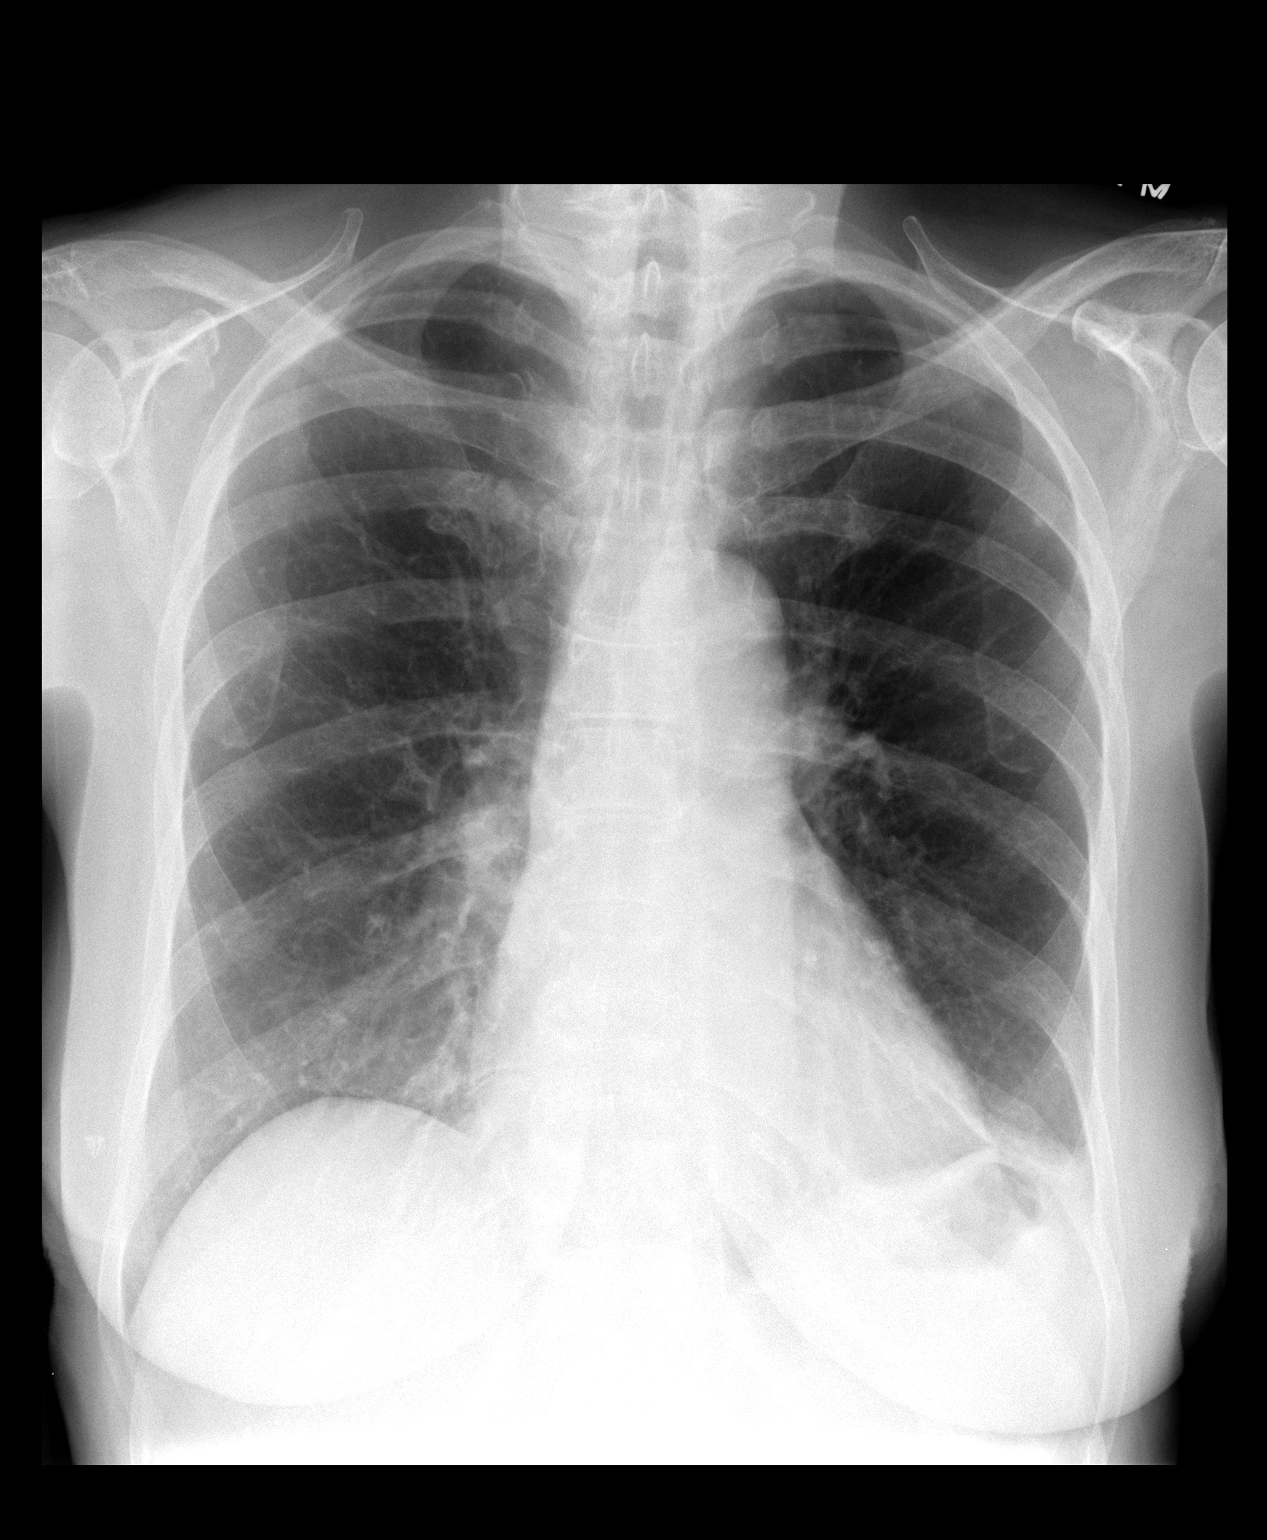

[view not recorded (2 of 2)]
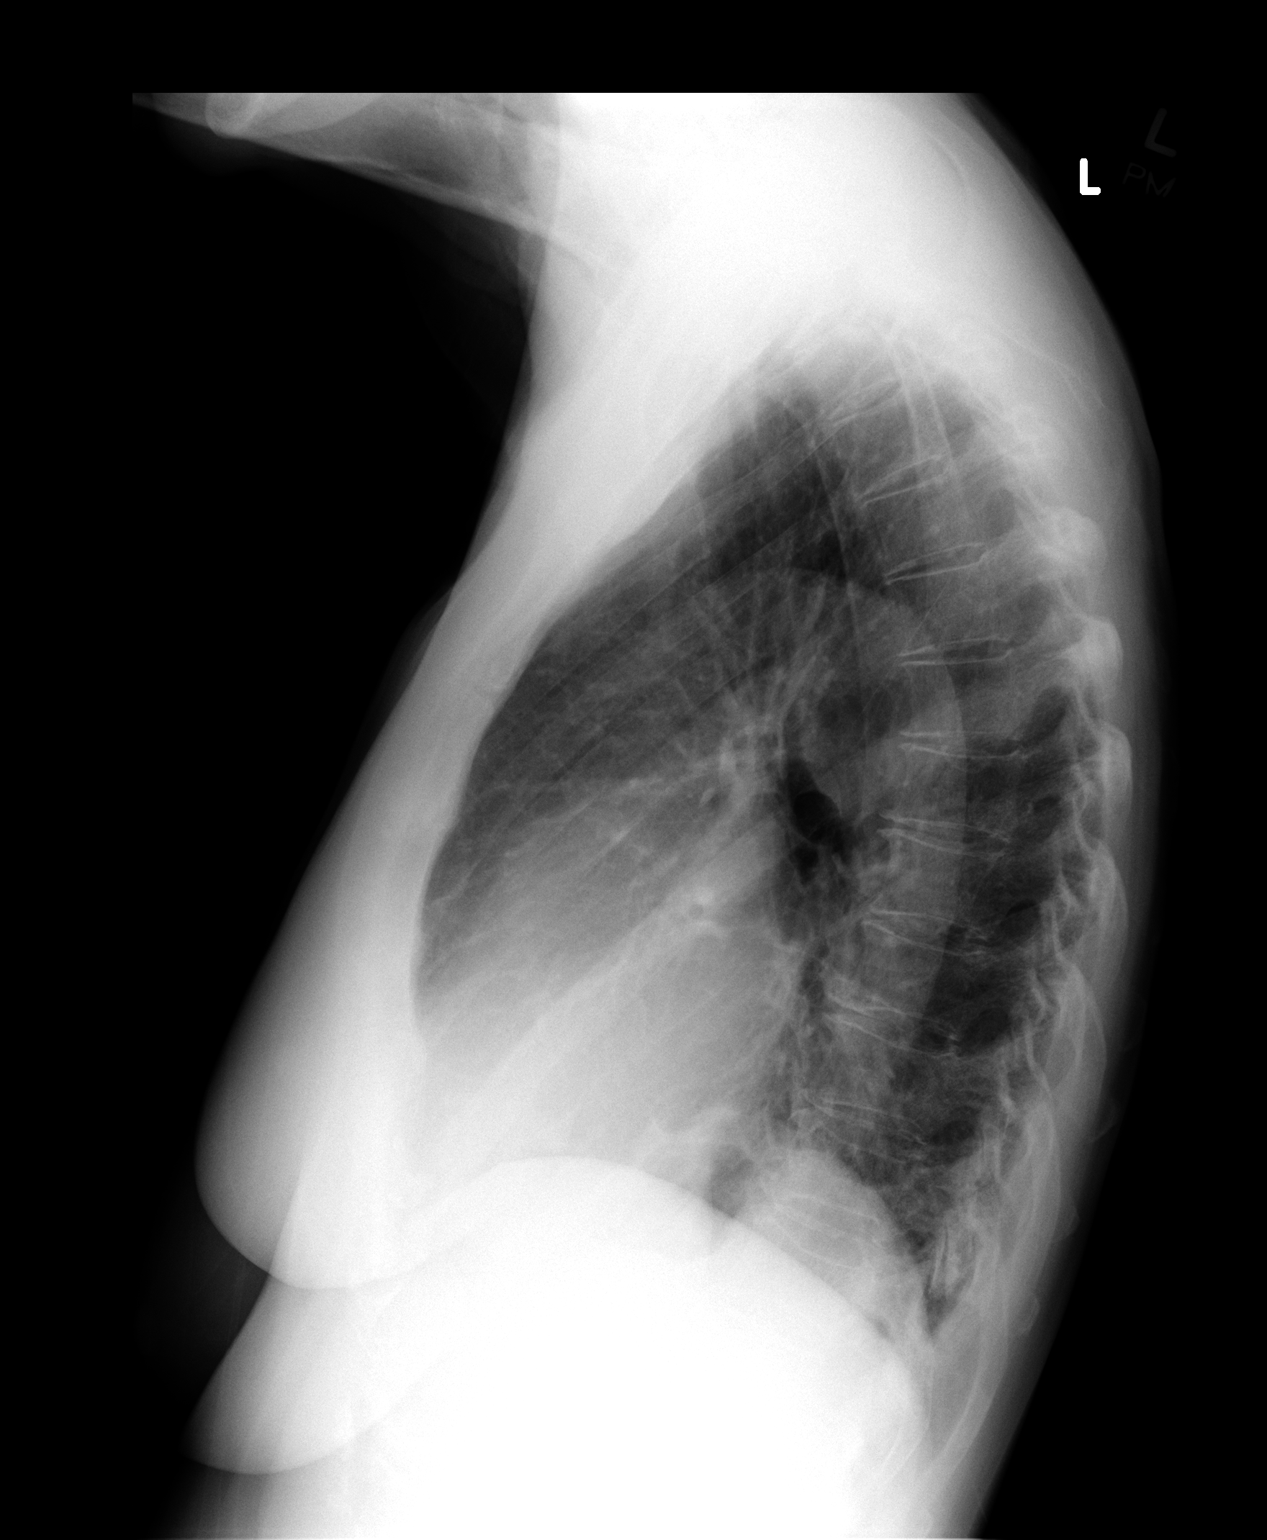

[2 of 2 positions shown; findings below may reference images not displayed]

FINDINGS: Lungs are hyperexpanded. The airspace disease seen previously in the
lower lobes has decreased in the interval, resolved on the right and
nearly resolved on the left. No new or progressive findings. The
cardiopericardial silhouette is within normal limits for size.
Imaged bony structures of the thorax are intact.
IMPRESSION: Interval resolution of right lower lobe airspace disease with
improvement in the airspace opacity seen previously at the left
base.
